# Patient Record
Sex: Female | Born: 1950
Health system: Southern US, Community
[De-identification: ages and names within clinical notes are randomized; demographics above are authoritative.]

## PROBLEM LIST (undated history)

## (undated) DIAGNOSIS — E119 Type 2 diabetes mellitus without complications: Secondary | ICD-10-CM

## (undated) DIAGNOSIS — M810 Age-related osteoporosis without current pathological fracture: Secondary | ICD-10-CM

## (undated) DIAGNOSIS — I10 Essential (primary) hypertension: Secondary | ICD-10-CM

## (undated) DIAGNOSIS — E785 Hyperlipidemia, unspecified: Secondary | ICD-10-CM

## (undated) DIAGNOSIS — C801 Malignant (primary) neoplasm, unspecified: Secondary | ICD-10-CM

## (undated) DIAGNOSIS — G629 Polyneuropathy, unspecified: Secondary | ICD-10-CM

## (undated) DIAGNOSIS — M1990 Unspecified osteoarthritis, unspecified site: Secondary | ICD-10-CM

## (undated) DIAGNOSIS — Z9109 Other allergy status, other than to drugs and biological substances: Secondary | ICD-10-CM

## (undated) DIAGNOSIS — K219 Gastro-esophageal reflux disease without esophagitis: Secondary | ICD-10-CM

## (undated) DIAGNOSIS — C50919 Malignant neoplasm of unspecified site of unspecified female breast: Secondary | ICD-10-CM

## (undated) HISTORY — DX: Type 2 diabetes mellitus without complications: E11.9

## (undated) HISTORY — DX: Age-related osteoporosis without current pathological fracture: M81.0

## (undated) HISTORY — DX: Polyneuropathy, unspecified: G62.9

## (undated) HISTORY — DX: Essential (primary) hypertension: I10

## (undated) HISTORY — DX: Other allergy status, other than to drugs and biological substances: Z91.09

## (undated) HISTORY — DX: Malignant (primary) neoplasm, unspecified: C80.1

## (undated) HISTORY — DX: Hyperlipidemia, unspecified: E78.5

## (undated) HISTORY — DX: Unspecified osteoarthritis, unspecified site: M19.90

## (undated) HISTORY — DX: Gastro-esophageal reflux disease without esophagitis: K21.9

---
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
## 1980-08-26 HISTORY — PX: APPENDECTOMY: SHX54

## 1982-08-26 HISTORY — PX: LAPAROSCOPIC SALPINGO OOPHERECTOMY: SHX5927

## 2000-11-18 ENCOUNTER — Ambulatory Visit (HOSPITAL_COMMUNITY): Admission: RE | Admit: 2000-11-18 | Discharge: 2000-11-18 | Payer: Self-pay | Admitting: Family Medicine

## 2000-11-18 ENCOUNTER — Encounter: Payer: Self-pay | Admitting: Family Medicine

## 2000-11-19 ENCOUNTER — Other Ambulatory Visit: Admission: RE | Admit: 2000-11-19 | Discharge: 2000-11-19 | Payer: Self-pay | Admitting: *Deleted

## 2000-11-24 ENCOUNTER — Encounter: Admission: RE | Admit: 2000-11-24 | Discharge: 2000-11-24 | Payer: Self-pay | Admitting: Family Medicine

## 2000-11-24 ENCOUNTER — Other Ambulatory Visit: Admission: RE | Admit: 2000-11-24 | Discharge: 2000-11-24 | Payer: Self-pay | Admitting: Family Medicine

## 2000-11-24 ENCOUNTER — Encounter: Payer: Self-pay | Admitting: Family Medicine

## 2000-11-25 ENCOUNTER — Encounter: Admission: RE | Admit: 2000-11-25 | Discharge: 2000-11-25 | Payer: Self-pay | Admitting: Family Medicine

## 2000-11-25 ENCOUNTER — Encounter: Payer: Self-pay | Admitting: Family Medicine

## 2000-12-02 ENCOUNTER — Encounter: Payer: Self-pay | Admitting: Surgery

## 2000-12-02 ENCOUNTER — Ambulatory Visit (HOSPITAL_COMMUNITY): Admission: RE | Admit: 2000-12-02 | Discharge: 2000-12-02 | Payer: Self-pay | Admitting: Surgery

## 2000-12-04 ENCOUNTER — Encounter: Admission: RE | Admit: 2000-12-04 | Discharge: 2001-03-04 | Payer: Self-pay | Admitting: Radiation Oncology

## 2000-12-12 ENCOUNTER — Ambulatory Visit (HOSPITAL_BASED_OUTPATIENT_CLINIC_OR_DEPARTMENT_OTHER): Admission: RE | Admit: 2000-12-12 | Discharge: 2000-12-13 | Payer: Self-pay | Admitting: Surgery

## 2000-12-12 ENCOUNTER — Encounter (INDEPENDENT_AMBULATORY_CARE_PROVIDER_SITE_OTHER): Payer: Self-pay | Admitting: *Deleted

## 2000-12-12 ENCOUNTER — Encounter: Payer: Self-pay | Admitting: Surgery

## 2000-12-12 DIAGNOSIS — C801 Malignant (primary) neoplasm, unspecified: Secondary | ICD-10-CM

## 2000-12-12 HISTORY — DX: Malignant (primary) neoplasm, unspecified: C80.1

## 2000-12-12 HISTORY — PX: BREAST LUMPECTOMY: SHX2

## 2000-12-29 ENCOUNTER — Ambulatory Visit (HOSPITAL_COMMUNITY): Admission: RE | Admit: 2000-12-29 | Discharge: 2000-12-29 | Payer: Self-pay | Admitting: Oncology

## 2000-12-29 ENCOUNTER — Encounter: Payer: Self-pay | Admitting: Oncology

## 2001-01-08 ENCOUNTER — Ambulatory Visit (HOSPITAL_COMMUNITY): Admission: RE | Admit: 2001-01-08 | Discharge: 2001-01-08 | Payer: Self-pay | Admitting: Oncology

## 2001-01-08 ENCOUNTER — Encounter: Payer: Self-pay | Admitting: Oncology

## 2001-01-12 ENCOUNTER — Encounter: Payer: Self-pay | Admitting: Surgery

## 2001-01-12 ENCOUNTER — Ambulatory Visit (HOSPITAL_COMMUNITY): Admission: RE | Admit: 2001-01-12 | Discharge: 2001-01-12 | Payer: Self-pay | Admitting: Surgery

## 2001-05-11 ENCOUNTER — Ambulatory Visit: Admission: RE | Admit: 2001-05-11 | Discharge: 2001-08-09 | Payer: Self-pay | Admitting: Radiation Oncology

## 2001-06-03 ENCOUNTER — Ambulatory Visit (HOSPITAL_BASED_OUTPATIENT_CLINIC_OR_DEPARTMENT_OTHER): Admission: RE | Admit: 2001-06-03 | Discharge: 2001-06-03 | Payer: Self-pay | Admitting: Surgery

## 2001-11-23 ENCOUNTER — Encounter: Payer: Self-pay | Admitting: Oncology

## 2001-11-23 ENCOUNTER — Encounter: Admission: RE | Admit: 2001-11-23 | Discharge: 2001-11-23 | Payer: Self-pay | Admitting: Oncology

## 2002-04-13 ENCOUNTER — Other Ambulatory Visit: Admission: RE | Admit: 2002-04-13 | Discharge: 2002-04-13 | Payer: Self-pay | Admitting: *Deleted

## 2002-07-21 ENCOUNTER — Ambulatory Visit (HOSPITAL_COMMUNITY): Admission: RE | Admit: 2002-07-21 | Discharge: 2002-07-21 | Payer: Self-pay | Admitting: Oncology

## 2002-07-21 ENCOUNTER — Encounter: Payer: Self-pay | Admitting: Oncology

## 2002-12-23 ENCOUNTER — Encounter: Payer: Self-pay | Admitting: Oncology

## 2002-12-23 ENCOUNTER — Encounter: Admission: RE | Admit: 2002-12-23 | Discharge: 2002-12-23 | Payer: Self-pay | Admitting: Oncology

## 2003-01-12 ENCOUNTER — Ambulatory Visit (HOSPITAL_COMMUNITY): Admission: RE | Admit: 2003-01-12 | Discharge: 2003-01-12 | Payer: Self-pay | Admitting: Oncology

## 2003-01-12 ENCOUNTER — Encounter: Payer: Self-pay | Admitting: Oncology

## 2003-04-13 ENCOUNTER — Ambulatory Visit (HOSPITAL_COMMUNITY): Admission: RE | Admit: 2003-04-13 | Discharge: 2003-04-13 | Payer: Self-pay | Admitting: Gastroenterology

## 2003-07-07 ENCOUNTER — Other Ambulatory Visit: Admission: RE | Admit: 2003-07-07 | Discharge: 2003-07-07 | Payer: Self-pay | Admitting: *Deleted

## 2003-11-22 ENCOUNTER — Encounter: Admission: RE | Admit: 2003-11-22 | Discharge: 2003-11-22 | Payer: Self-pay | Admitting: Oncology

## 2003-11-28 ENCOUNTER — Ambulatory Visit (HOSPITAL_COMMUNITY): Admission: RE | Admit: 2003-11-28 | Discharge: 2003-11-28 | Payer: Self-pay | Admitting: Oncology

## 2004-09-07 ENCOUNTER — Ambulatory Visit: Payer: Self-pay | Admitting: Oncology

## 2004-11-27 ENCOUNTER — Encounter: Admission: RE | Admit: 2004-11-27 | Discharge: 2004-11-27 | Payer: Self-pay | Admitting: Oncology

## 2005-03-04 ENCOUNTER — Ambulatory Visit: Payer: Self-pay | Admitting: Oncology

## 2005-09-02 ENCOUNTER — Ambulatory Visit: Payer: Self-pay | Admitting: Oncology

## 2005-10-25 ENCOUNTER — Ambulatory Visit: Payer: Self-pay | Admitting: Oncology

## 2005-12-17 ENCOUNTER — Ambulatory Visit (HOSPITAL_COMMUNITY): Admission: RE | Admit: 2005-12-17 | Discharge: 2005-12-17 | Payer: Self-pay | Admitting: Oncology

## 2005-12-17 ENCOUNTER — Encounter: Admission: RE | Admit: 2005-12-17 | Discharge: 2005-12-17 | Payer: Self-pay | Admitting: Oncology

## 2006-04-23 ENCOUNTER — Ambulatory Visit: Payer: Self-pay | Admitting: Oncology

## 2006-04-25 LAB — CANCER ANTIGEN 27.29: CA 27.29: 13 U/mL (ref 0–39)

## 2006-04-25 LAB — CBC WITH DIFFERENTIAL/PLATELET
BASO%: 0.4 % (ref 0.0–2.0)
HCT: 37.5 % (ref 34.8–46.6)
HGB: 12.1 g/dL (ref 11.6–15.9)
MCHC: 32.2 g/dL (ref 32.0–36.0)
MONO#: 0.8 10*3/uL (ref 0.1–0.9)
NEUT%: 51.8 % (ref 39.6–76.8)
RDW: 15.1 % — ABNORMAL HIGH (ref 11.3–14.5)
WBC: 7.9 10*3/uL (ref 3.9–10.0)
lymph#: 2.6 10*3/uL (ref 0.9–3.3)

## 2006-04-25 LAB — COMPREHENSIVE METABOLIC PANEL
ALT: 31 U/L (ref 0–40)
Albumin: 4.1 g/dL (ref 3.5–5.2)
CO2: 26 mEq/L (ref 19–32)
Calcium: 9.3 mg/dL (ref 8.4–10.5)
Chloride: 105 mEq/L (ref 96–112)
Creatinine, Ser: 0.63 mg/dL (ref 0.40–1.20)
Potassium: 4.3 mEq/L (ref 3.5–5.3)
Sodium: 141 mEq/L (ref 135–145)
Total Protein: 7.5 g/dL (ref 6.0–8.3)

## 2006-06-06 ENCOUNTER — Ambulatory Visit: Payer: Self-pay | Admitting: Oncology

## 2006-08-11 ENCOUNTER — Ambulatory Visit (HOSPITAL_COMMUNITY): Admission: RE | Admit: 2006-08-11 | Discharge: 2006-08-11 | Payer: Self-pay | Admitting: Oncology

## 2006-12-09 ENCOUNTER — Ambulatory Visit: Payer: Self-pay | Admitting: Oncology

## 2006-12-09 LAB — CBC WITH DIFFERENTIAL/PLATELET
BASO%: 0.4 % (ref 0.0–2.0)
EOS%: 3.7 % (ref 0.0–7.0)
MCH: 24.7 pg — ABNORMAL LOW (ref 26.0–34.0)
MCHC: 32.9 g/dL (ref 32.0–36.0)
MCV: 75.3 fL — ABNORMAL LOW (ref 81.0–101.0)
MONO%: 8.2 % (ref 0.0–13.0)
RBC: 4.92 10*6/uL (ref 3.70–5.32)
RDW: 14.9 % — ABNORMAL HIGH (ref 11.3–14.5)
lymph#: 3 10*3/uL (ref 0.9–3.3)

## 2006-12-09 LAB — COMPREHENSIVE METABOLIC PANEL
AST: 26 U/L (ref 0–37)
Alkaline Phosphatase: 91 U/L (ref 39–117)
BUN: 8 mg/dL (ref 6–23)
Calcium: 9 mg/dL (ref 8.4–10.5)
Chloride: 106 mEq/L (ref 96–112)
Creatinine, Ser: 0.67 mg/dL (ref 0.40–1.20)

## 2006-12-23 ENCOUNTER — Encounter: Admission: RE | Admit: 2006-12-23 | Discharge: 2006-12-23 | Payer: Self-pay | Admitting: Oncology

## 2007-01-12 ENCOUNTER — Encounter: Admission: RE | Admit: 2007-01-12 | Discharge: 2007-01-12 | Payer: Self-pay | Admitting: Oncology

## 2007-02-09 ENCOUNTER — Ambulatory Visit: Payer: Self-pay | Admitting: Oncology

## 2007-04-12 ENCOUNTER — Ambulatory Visit: Payer: Self-pay | Admitting: Oncology

## 2007-04-16 LAB — FOLLICLE STIMULATING HORMONE: FSH: 12.4 m[IU]/mL

## 2007-04-24 LAB — ESTRADIOL, ULTRA SENS: Estradiol, Ultra Sensitive: 15 pg/mL

## 2007-07-10 ENCOUNTER — Ambulatory Visit: Payer: Self-pay | Admitting: Oncology

## 2007-07-16 LAB — COMPREHENSIVE METABOLIC PANEL
Albumin: 4.2 g/dL (ref 3.5–5.2)
BUN: 11 mg/dL (ref 6–23)
CO2: 24 mEq/L (ref 19–32)
Glucose, Bld: 84 mg/dL (ref 70–99)
Potassium: 4.3 mEq/L (ref 3.5–5.3)
Sodium: 140 mEq/L (ref 135–145)
Total Protein: 7.5 g/dL (ref 6.0–8.3)

## 2007-07-16 LAB — CBC WITH DIFFERENTIAL/PLATELET
BASO%: 0.1 % (ref 0.0–2.0)
HCT: 37.7 % (ref 34.8–46.6)
MCHC: 32.9 g/dL (ref 32.0–36.0)
MONO#: 0.6 10*3/uL (ref 0.1–0.9)
NEUT%: 46.7 % (ref 39.6–76.8)
RDW: 14.1 % (ref 11.3–14.5)
WBC: 6.9 10*3/uL (ref 3.9–10.0)
lymph#: 2.9 10*3/uL (ref 0.9–3.3)

## 2007-07-16 LAB — CANCER ANTIGEN 27.29: CA 27.29: 15 U/mL (ref 0–39)

## 2007-07-16 LAB — FOLLICLE STIMULATING HORMONE: FSH: 13 m[IU]/mL

## 2007-07-27 LAB — ESTRADIOL, ULTRA SENS: Estradiol, Ultra Sensitive: 16 pg/mL

## 2007-07-28 LAB — BASIC METABOLIC PANEL
BUN: 9 mg/dL (ref 6–23)
Calcium: 9.7 mg/dL (ref 8.4–10.5)
Creatinine, Ser: 0.64 mg/dL (ref 0.40–1.20)

## 2007-12-08 ENCOUNTER — Ambulatory Visit: Payer: Self-pay | Admitting: Oncology

## 2007-12-10 LAB — CBC WITH DIFFERENTIAL/PLATELET
BASO%: 0.4 % (ref 0.0–2.0)
Basophils Absolute: 0 10*3/uL (ref 0.0–0.1)
EOS%: 3.1 % (ref 0.0–7.0)
MCH: 24.4 pg — ABNORMAL LOW (ref 26.0–34.0)
MCHC: 32.8 g/dL (ref 32.0–36.0)
MCV: 74.6 fL — ABNORMAL LOW (ref 81.0–101.0)
MONO%: 6.1 % (ref 0.0–13.0)
RBC: 5.45 10*6/uL — ABNORMAL HIGH (ref 3.70–5.32)
RDW: 15.2 % — ABNORMAL HIGH (ref 11.3–14.5)
lymph#: 3.3 10*3/uL (ref 0.9–3.3)

## 2007-12-11 LAB — COMPREHENSIVE METABOLIC PANEL
ALT: 22 U/L (ref 0–35)
AST: 21 U/L (ref 0–37)
Albumin: 4.4 g/dL (ref 3.5–5.2)
Alkaline Phosphatase: 74 U/L (ref 39–117)
BUN: 14 mg/dL (ref 6–23)
Potassium: 4.2 mEq/L (ref 3.5–5.3)
Sodium: 141 mEq/L (ref 135–145)

## 2007-12-11 LAB — VITAMIN D 25 HYDROXY (VIT D DEFICIENCY, FRACTURES): Vit D, 25-Hydroxy: 63 ng/mL (ref 30–89)

## 2007-12-17 LAB — VITAMIN D 1,25 DIHYDROXY: Vit D, 1,25-Dihydroxy: 37 pg/mL (ref 15–75)

## 2007-12-31 ENCOUNTER — Encounter: Admission: RE | Admit: 2007-12-31 | Discharge: 2007-12-31 | Payer: Self-pay | Admitting: Oncology

## 2008-02-17 ENCOUNTER — Ambulatory Visit: Payer: Self-pay | Admitting: Oncology

## 2008-02-19 LAB — BASIC METABOLIC PANEL
BUN: 13 mg/dL (ref 6–23)
CO2: 23 mEq/L (ref 19–32)
Calcium: 8.6 mg/dL (ref 8.4–10.5)
Chloride: 105 mEq/L (ref 96–112)
Creatinine, Ser: 0.65 mg/dL (ref 0.40–1.20)
Glucose, Bld: 95 mg/dL (ref 70–99)

## 2008-06-28 ENCOUNTER — Ambulatory Visit: Payer: Self-pay | Admitting: Oncology

## 2008-06-30 LAB — CBC WITH DIFFERENTIAL/PLATELET
BASO%: 0.5 % (ref 0.0–2.0)
Basophils Absolute: 0 10*3/uL (ref 0.0–0.1)
EOS%: 5.2 % (ref 0.0–7.0)
LYMPH%: 28.5 % (ref 14.0–48.0)
MCH: 24.4 pg — ABNORMAL LOW (ref 26.0–34.0)
MCHC: 32.2 g/dL (ref 32.0–36.0)
MONO#: 0.5 10*3/uL (ref 0.1–0.9)
MONO%: 6.5 % (ref 0.0–13.0)
NEUT#: 4.3 10*3/uL (ref 1.5–6.5)
Platelets: 249 10*3/uL (ref 145–400)
WBC: 7.3 10*3/uL (ref 3.9–10.0)
lymph#: 2.1 10*3/uL (ref 0.9–3.3)

## 2008-07-01 LAB — COMPREHENSIVE METABOLIC PANEL
ALT: 15 U/L (ref 0–35)
AST: 19 U/L (ref 0–37)
Albumin: 4.3 g/dL (ref 3.5–5.2)
BUN: 11 mg/dL (ref 6–23)
CO2: 27 mEq/L (ref 19–32)
Sodium: 142 mEq/L (ref 135–145)

## 2008-07-01 LAB — CANCER ANTIGEN 27.29: CA 27.29: 21 U/mL (ref 0–39)

## 2009-01-09 ENCOUNTER — Encounter: Admission: RE | Admit: 2009-01-09 | Discharge: 2009-01-09 | Payer: Self-pay | Admitting: Surgery

## 2009-01-25 ENCOUNTER — Ambulatory Visit: Payer: Self-pay | Admitting: Oncology

## 2009-05-02 ENCOUNTER — Encounter: Payer: Self-pay | Admitting: Family Medicine

## 2009-05-02 ENCOUNTER — Ambulatory Visit: Payer: Self-pay | Admitting: Family Medicine

## 2009-05-02 ENCOUNTER — Other Ambulatory Visit: Admission: RE | Admit: 2009-05-02 | Discharge: 2009-05-02 | Payer: Self-pay | Admitting: Family Medicine

## 2009-05-02 DIAGNOSIS — E785 Hyperlipidemia, unspecified: Secondary | ICD-10-CM | POA: Insufficient documentation

## 2009-05-03 DIAGNOSIS — E1165 Type 2 diabetes mellitus with hyperglycemia: Secondary | ICD-10-CM

## 2009-05-03 DIAGNOSIS — E1140 Type 2 diabetes mellitus with diabetic neuropathy, unspecified: Secondary | ICD-10-CM

## 2009-05-03 DIAGNOSIS — E118 Type 2 diabetes mellitus with unspecified complications: Secondary | ICD-10-CM | POA: Insufficient documentation

## 2009-05-03 DIAGNOSIS — E119 Type 2 diabetes mellitus without complications: Secondary | ICD-10-CM | POA: Insufficient documentation

## 2009-05-04 LAB — CONVERTED CEMR LAB
Alkaline Phosphatase: 55 units/L (ref 39–117)
BUN: 10 mg/dL (ref 6–23)
Calcium: 9.6 mg/dL (ref 8.4–10.5)
Cholesterol: 253 mg/dL — ABNORMAL HIGH (ref 0–200)
Creatinine, Ser: 0.68 mg/dL (ref 0.40–1.20)
Glucose, Bld: 112 mg/dL — ABNORMAL HIGH (ref 70–99)
Potassium: 4.5 meq/L (ref 3.5–5.3)
Total Bilirubin: 0.4 mg/dL (ref 0.3–1.2)
Total Protein: 8 g/dL (ref 6.0–8.3)

## 2009-06-27 ENCOUNTER — Ambulatory Visit: Payer: Self-pay | Admitting: Oncology

## 2009-06-29 LAB — CBC WITH DIFFERENTIAL/PLATELET
Basophils Absolute: 0.1 10*3/uL (ref 0.0–0.1)
Eosinophils Absolute: 0.4 10*3/uL (ref 0.0–0.5)
HGB: 12.7 g/dL (ref 11.6–15.9)
MCHC: 31.7 g/dL (ref 31.5–36.0)
MCV: 76.7 fL — ABNORMAL LOW (ref 79.5–101.0)
MONO#: 0.6 10*3/uL (ref 0.1–0.9)
Platelets: 218 10*3/uL (ref 145–400)
nRBC: 0 % (ref 0–0)

## 2009-06-30 LAB — VITAMIN D 25 HYDROXY (VIT D DEFICIENCY, FRACTURES): Vit D, 25-Hydroxy: 88 ng/mL (ref 30–89)

## 2009-06-30 LAB — COMPREHENSIVE METABOLIC PANEL
BUN: 11 mg/dL (ref 6–23)
Calcium: 9.8 mg/dL (ref 8.4–10.5)
Glucose, Bld: 86 mg/dL (ref 70–99)
Potassium: 4.4 mEq/L (ref 3.5–5.3)
Sodium: 141 mEq/L (ref 135–145)

## 2009-06-30 LAB — CANCER ANTIGEN 27.29: CA 27.29: 21 U/mL (ref 0–39)

## 2009-07-27 ENCOUNTER — Ambulatory Visit: Payer: Self-pay | Admitting: Oncology

## 2010-01-15 ENCOUNTER — Encounter: Admission: RE | Admit: 2010-01-15 | Discharge: 2010-01-15 | Payer: Self-pay | Admitting: Surgery

## 2010-02-01 ENCOUNTER — Ambulatory Visit: Payer: Self-pay | Admitting: Oncology

## 2010-06-26 ENCOUNTER — Ambulatory Visit: Payer: Self-pay | Admitting: Oncology

## 2010-06-28 LAB — CBC WITH DIFFERENTIAL/PLATELET
EOS%: 4.1 % (ref 0.0–7.0)
Eosinophils Absolute: 0.3 10*3/uL (ref 0.0–0.5)
LYMPH%: 38 % (ref 14.0–49.7)
MCH: 24.4 pg — ABNORMAL LOW (ref 25.1–34.0)
MCHC: 32 g/dL (ref 31.5–36.0)
MONO#: 0.5 10*3/uL (ref 0.1–0.9)
NEUT#: 3.4 10*3/uL (ref 1.5–6.5)
NEUT%: 50.3 % (ref 38.4–76.8)
RDW: 14.6 % — ABNORMAL HIGH (ref 11.2–14.5)
lymph#: 2.6 10*3/uL (ref 0.9–3.3)

## 2010-06-29 LAB — COMPREHENSIVE METABOLIC PANEL
ALT: 21 U/L (ref 0–35)
AST: 22 U/L (ref 0–37)
Albumin: 4.6 g/dL (ref 3.5–5.2)
Alkaline Phosphatase: 67 U/L (ref 39–117)
Creatinine, Ser: 0.79 mg/dL (ref 0.40–1.20)
Sodium: 140 mEq/L (ref 135–145)

## 2010-06-29 LAB — VITAMIN D 25 HYDROXY (VIT D DEFICIENCY, FRACTURES): Vit D, 25-Hydroxy: 38 ng/mL (ref 30–89)

## 2010-06-29 LAB — CANCER ANTIGEN 27.29: CA 27.29: 23 U/mL (ref 0–39)

## 2010-08-02 ENCOUNTER — Ambulatory Visit: Payer: Self-pay | Admitting: Oncology

## 2010-08-02 DIAGNOSIS — J301 Allergic rhinitis due to pollen: Secondary | ICD-10-CM

## 2010-08-06 LAB — BASIC METABOLIC PANEL
CO2: 24 mEq/L (ref 19–32)
Creatinine, Ser: 0.73 mg/dL (ref 0.40–1.20)
Glucose, Bld: 135 mg/dL — ABNORMAL HIGH (ref 70–99)
Potassium: 3.9 mEq/L (ref 3.5–5.3)

## 2010-09-15 ENCOUNTER — Other Ambulatory Visit: Payer: Self-pay | Admitting: Oncology

## 2010-09-15 DIAGNOSIS — Z853 Personal history of malignant neoplasm of breast: Secondary | ICD-10-CM

## 2010-09-15 DIAGNOSIS — Z0000 Encounter for general adult medical examination without abnormal findings: Secondary | ICD-10-CM

## 2010-09-15 DIAGNOSIS — Z780 Asymptomatic menopausal state: Secondary | ICD-10-CM

## 2011-01-11 NOTE — Op Note (Signed)
[place]. [facility]  Patient:    Elizabeth Vang, Elizabeth Vang                 MRN: [id number] Proc. Date: 12/12/00 Adm. Date:  [id number] Disc. Date: [id number] Attending:  Andre Lefort CC:         Chales Salmon. Abigail Miyamoto, M.D.  Daryl Eastern, M.D.   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  POSTOPERATIVE DIAGNOSIS:  Carcinoma of the right breast 3 oclock position with two masses per mammogram/ ultrasound and positive sentinel lymph node biopsy.  OPERATION:  Needle localization x 2 with a segmental mastectomy of the right breast, excising the breast tissue around the 3 oclock position, injection of isosulfan blue, sentinel lymph node biopsy, right axillary node dissection.  SURGEON:  Sandria Bales. Ezzard Standing, M.D.  ANESTHESIA:  General LMA.  ESTIMATED BLOOD LOSS: 100 cc.  DRAIN LEFT IN:  10 mm Blake.  COMPLICATIONS:  None.  INDICATIONS:  Elizabeth Vang is a 60 year old, just turned 60 year old black female who has a biopsy-proven carcinoma in the medial aspect of her right breast.  Actually by mammogram, there is a suggestion of two masses in this area.  I spoke with Dr. Cain Saupe about trying to bracket these two masses with needle localization a the time of excision.  So we planned lumpectomy (segmental mastectomy) and sentinel lymph node biopsy and also discussed the possibility of open axillary node dissection.  DESCRIPTION OF PROCEDURE:  The patient was placed in a supine position, given a general anesthesia using LMA.  Her right breast was prepped with Betadine solution and sterilely draped.  Her nipple had already been injected with the radioactive colloid.  I then injected isosulfan blue, two 0.5 cc injections around her nipple, 1 cc at the site of her tumor.  I then started with the axillary dissection.  I identified a hot  area immediately posterior to the pectoralis muscle anterior in the axilla and took my dissection down to finding actually an abnormal-looking node which was probably 2.5 cm in size.  This node was highly suspicious for malignancy.  I sent it off, and ______ called me and said it did have cancer cells in it.  While this was going on, I did a wide excision of her right breast around the 3 oclock position.  I used both wires and excised a length of skin about 10 cm long and about 3 cm wide.  I tried to get well outside of any palpable area of mass.  I carried this excision all the way down to the chest wall and then marked the tissue medial towards the sternum with a long suture cephalad with a shorter suture and sent it to specimen mammography to confirm I got all the suspicious area out.  I then sent it permanently to pathology.  I then turned my attention back to the axilla where I extended her right axillary incision, took my dissection up to the axillary vein which was identified, swept all the axillary contents inferiorly, took two tributaries off the axillary vein using the clip applier.  I identified the long thoracic nerve and dorsal nerves posteriorly and took the axillary contents as a block specimen.  I irrigated the axilla.  Hemostasis was controlled with Bovie electrocautery, clips, and 3-0 Vicryl popoff.  I then placed a 10 mm Blake drain through a stab  wound inferior to the axillary incision, closed the skin with 3-0 Vicryl sutures and a skin gun for the axilla, sewed in the drain with 2-0 nylon suture, and then I turned my attention to the breast biopsy site.  I irrigated this out, controlled the bleeding with Bovie electrocautery and 3-0 Vicryl sutures.  I closed the subcutaneous tissue with 3-0 Vicryl sutures, closed the skin with a 5-0 Monocryl suture, painted the wound with tincture of Benzoin and Steri-Stripped the wound.  I then sterilely dressed both wounds. The  patient will be kept overnight for observation and instruction on how to change the drain and will plan to be discharged home tomorrow.  She will see me back in four to five days for wound check. DD:  12/12/00 TD:  12/15/00 Job: [id number]

## 2011-01-11 NOTE — Op Note (Signed)
   NAME:  Elizabeth Vang, Elizabeth Vang                    ACCOUNT NO.:  [id number]   MEDICAL RECORD NO.:  [id number]                   PATIENT TYPE:  AMB   LOCATION:  ENDO                                 FACILITY:  [facility]   PHYSICIAN:  Bernette Redbird, M.D.                DATE OF BIRTH:  05-Apr-1951   DATE OF PROCEDURE:  04/13/2003  DATE OF DISCHARGE:                                 OPERATIVE REPORT   PROCEDURE:  Colonoscopy.   INDICATION:  Screening for colon cancer in a 60 year old female.   FINDINGS:  Normal exam to the cecum.   DESCRIPTION OF PROCEDURE:  The nature, purpose, and risks of the procedure  had been discussed with the patient who provided written consent.  Sedation  was fentanyl 75 mcg and Versed 6 mg IV without arrhythmias or desaturation.   The Olympus adult video colonoscope was advanced without significant  difficulty around the colon, turning the patient into the supine position to  get the tip of the scope to enter the base of the cecum which was identified  by visualization of the appendiceal orifice.  Pullback was then performed.  The quality of the prep was excellent, and it is felt that all areas were  well-seen.   This was a normal examination.  No polyps, cancer, colitis, vascular  malformations, or diverticulosis were noted.  Retroflexion could not be  accomplished in the rectum, but re-inspection and repeat re-inspection of  the rectosigmoid was unremarkable.  No biopsy were obtained.  The patient  tolerated the procedure well, and there were no apparent complications.    IMPRESSION:  Normal screening colonoscopy.   PLAN:  In view of the patient's history of breast cancer, I might consider a  repeat colonoscopy in five years.                                               Bernette Redbird, M.D.    RB/MEDQ  D:  04/13/2003  T:  04/13/2003  Job:  [id number]   cc:   Valentino Hue. Magrinat, M.D.  [address] [facility]  [place]  [place] [address]  Fax: [phone]   Chales Salmon. Abigail Miyamoto, M.D.  [address]  [place]  [place] [address]  Fax: [phone]

## 2011-01-11 NOTE — Op Note (Signed)
[facility]  Patient:    Elizabeth Vang, Elizabeth Vang                 MRN: [id number] Proc. Date: 01/12/01 Adm. Date:  [id number] Attending:  Andre Lefort CC:         Chales Salmon. Abigail Miyamoto, M.D.  Valentino Hue. Magrinat, M.D.   Operative Report  DATE OF BIRTH:  1951-03-09  PREOPERATIVE DIAGNOSIS:  Right breast cancer, need of body access.  POSTOPERATIVE DIAGNOSIS:  Right breast cancer, need of body access.  PROCEDURE:  Left subclavian Port-A-Cath placement.  SURGEON:  Sandria Bales. Ezzard Standing, M.D.  FIRST ASSISTANT:  None.  ANESTHESIA:  MAC with approximately 30 cc of 1% Xylocaine.  DESCRIPTION OF PROCEDURE:  Ms. Oatley is a 60 year old black female, who has stage 2 carcinoma of the right breast anticipating chemotherapy by Dr. Jeanette Caprice, comes for placement of a left subclavian Port-A-Cath.  DESCRIPTION OF PROCEDURE:  The patient placed in a Trendelenburg position with a roll under her back, given 1 gram of Ancef at the initiation of the procedure.  Her upper shoulders were prepped with Betadine solution and sterilely draped.  Ms. Kucinski is fully big-boned, and I had to stick her about 4-5 times to get under her clavicle into the left subclavian vein.  I was able to access the vein through the guidewire, and the guidewires position was checked with fluoroscopy showing position was in the superior vena cava.  I then developed a reservoir site pocket, sewed the reservoir in using 3-0 Vicryl sutures, passed the Silastic tubing from the reservoir site to the subclavian suture site and threaded it through an 8.9 [place] introducer.  It was fairly tight, so I then placed a 10 [place] Cook introducer.  This dilated up the area where the tubing went in easily.  I was then able to easily inject and aspirate saline.  I positioned the tip of the catheter using fluoroscopy in the superior vena cava at the atrial orifice.  I then attached the Silastic tubing  to the reservoir using a bayonet device, flushed the entire unit with heparinized saline and then checked the position of both the reservoir and the tubing, all without a kink.  It was seen to be going fairly well.  I then sewed the port reservoir site with 3-0 Vicryl sutures, the skin with a 5-0 subcuticular Vicryl suture, painted with Benzoin, Steri-Strips, and sterile dressed the wound.  I then placed a right angle Huber needle in, used concentrated heparin 100 units per cc as a flush in preparation for her chemotherapy tomorrow.  She will shower in two days, will be placed on Coumadin and see me back in one week for follow-up.  Chest x-ray is pending at the time of dictation. DD:  01/12/01 TD:  01/12/01 Job: [id number]

## 2011-01-14 ENCOUNTER — Inpatient Hospital Stay: Admission: RE | Admit: 2011-01-14 | Payer: Self-pay | Source: Ambulatory Visit

## 2011-01-16 ENCOUNTER — Ambulatory Visit
Admission: RE | Admit: 2011-01-16 | Discharge: 2011-01-16 | Disposition: A | Discharge status: Home / Self Care | Payer: Self-pay | Source: Ambulatory Visit | Attending: Oncology | Admitting: Oncology

## 2011-01-16 DIAGNOSIS — Z0000 Encounter for general adult medical examination without abnormal findings: Secondary | ICD-10-CM

## 2011-01-16 DIAGNOSIS — Z853 Personal history of malignant neoplasm of breast: Secondary | ICD-10-CM

## 2011-01-16 DIAGNOSIS — Z780 Asymptomatic menopausal state: Secondary | ICD-10-CM

## 2011-05-14 ENCOUNTER — Other Ambulatory Visit: Payer: Self-pay | Admitting: Oncology

## 2011-05-14 ENCOUNTER — Encounter (HOSPITAL_BASED_OUTPATIENT_CLINIC_OR_DEPARTMENT_OTHER): Payer: 59 | Admitting: Oncology

## 2011-05-14 DIAGNOSIS — C50319 Malignant neoplasm of lower-inner quadrant of unspecified female breast: Secondary | ICD-10-CM

## 2011-05-14 DIAGNOSIS — Z170 Estrogen receptor positive status [ER+]: Secondary | ICD-10-CM

## 2011-05-14 DIAGNOSIS — M899 Disorder of bone, unspecified: Secondary | ICD-10-CM

## 2011-05-14 DIAGNOSIS — M949 Disorder of cartilage, unspecified: Secondary | ICD-10-CM

## 2011-05-14 LAB — COMPREHENSIVE METABOLIC PANEL
Albumin: 4.2 g/dL (ref 3.5–5.2)
BUN: 10 mg/dL (ref 6–23)
Calcium: 9.3 mg/dL (ref 8.4–10.5)
Chloride: 104 mEq/L (ref 96–112)
Creatinine, Ser: 0.69 mg/dL (ref 0.50–1.10)
Glucose, Bld: 87 mg/dL (ref 70–99)
Potassium: 4.4 mEq/L (ref 3.5–5.3)

## 2011-05-14 LAB — CBC WITH DIFFERENTIAL/PLATELET
Eosinophils Absolute: 0.5 10*3/uL (ref 0.0–0.5)
HGB: 12.3 g/dL (ref 11.6–15.9)
MONO#: 0.8 10*3/uL (ref 0.1–0.9)
MONO%: 10.1 % (ref 0.0–14.0)
NEUT#: 3.6 10*3/uL (ref 1.5–6.5)
RBC: 5.06 10*6/uL (ref 3.70–5.45)
RDW: 14.9 % — ABNORMAL HIGH (ref 11.2–14.5)
WBC: 7.7 10*3/uL (ref 3.9–10.3)
lymph#: 2.7 10*3/uL (ref 0.9–3.3)

## 2011-05-14 LAB — CANCER ANTIGEN 27.29: CA 27.29: 21 U/mL (ref 0–39)

## 2011-05-14 LAB — VITAMIN D 25 HYDROXY (VIT D DEFICIENCY, FRACTURES): Vit D, 25-Hydroxy: 53 ng/mL (ref 30–89)

## 2011-05-21 ENCOUNTER — Encounter (HOSPITAL_BASED_OUTPATIENT_CLINIC_OR_DEPARTMENT_OTHER): Payer: 59 | Admitting: Oncology

## 2011-05-21 DIAGNOSIS — M810 Age-related osteoporosis without current pathological fracture: Secondary | ICD-10-CM

## 2011-05-21 DIAGNOSIS — C50919 Malignant neoplasm of unspecified site of unspecified female breast: Secondary | ICD-10-CM

## 2011-06-03 ENCOUNTER — Encounter (HOSPITAL_BASED_OUTPATIENT_CLINIC_OR_DEPARTMENT_OTHER): Payer: 59 | Admitting: Oncology

## 2011-06-03 ENCOUNTER — Other Ambulatory Visit: Payer: Self-pay | Admitting: Oncology

## 2011-06-03 DIAGNOSIS — M810 Age-related osteoporosis without current pathological fracture: Secondary | ICD-10-CM

## 2011-06-03 LAB — BASIC METABOLIC PANEL
BUN: 15 mg/dL (ref 6–23)
CO2: 22 mEq/L (ref 19–32)
Calcium: 9.6 mg/dL (ref 8.4–10.5)
Chloride: 103 mEq/L (ref 96–112)
Creatinine, Ser: 0.69 mg/dL (ref 0.50–1.10)
Glucose, Bld: 85 mg/dL (ref 70–99)

## 2011-12-30 ENCOUNTER — Other Ambulatory Visit: Payer: Self-pay | Admitting: Oncology

## 2011-12-30 DIAGNOSIS — Z1231 Encounter for screening mammogram for malignant neoplasm of breast: Secondary | ICD-10-CM

## 2012-01-22 ENCOUNTER — Telehealth: Payer: Self-pay

## 2012-01-22 ENCOUNTER — Telehealth: Payer: Self-pay | Admitting: *Deleted

## 2012-01-22 ENCOUNTER — Other Ambulatory Visit: Payer: Self-pay

## 2012-01-22 DIAGNOSIS — C50919 Malignant neoplasm of unspecified site of unspecified female breast: Secondary | ICD-10-CM

## 2012-01-22 MED ORDER — CEPHALEXIN 500 MG PO CAPS: 500.0000 mg | ORAL_CAPSULE | Freq: Two times a day (BID) | ORAL | Status: AC

## 2012-01-22 NOTE — Telephone Encounter (Signed)
Called pt to inform her per Dr. Darnelle Catalan, ok to call in antibiotic (keflex 500 mg po bid x 5days), and pt may come by office if she feels like this should be examined.  Pt states this is "the exact same thing as before," and she does not feel like she needs to come in to office.  Informed her to call office back if symptoms do not resolve.  Pt verbalizes understanding.

## 2012-01-22 NOTE — Telephone Encounter (Signed)
Received call from pt asking for an ATB & reports that she has cellulitis in her breast again & it is hot, inflamed, swollen, & painful.  She would like something called to [facility] Out pt. Pharm.  She can be reached at [phone].  Note to Dr. Darnelle Catalan.

## 2012-01-22 NOTE — Telephone Encounter (Signed)
Received message from pt stating that she believes she has another inflammation in her R breast where "her cancer was."  Pt states she believes this may be a cellulitis, and she has had antibiotics before for this, would like a prescription called in to [facility] outpt pharmacy.  She states her breast is hot, red, tight, and painful.  Return number is [phone].  Note to MD.

## 2012-01-28 ENCOUNTER — Ambulatory Visit
Admission: RE | Admit: 2012-01-28 | Discharge: 2012-01-28 | Disposition: A | Discharge status: Home / Self Care | Payer: 59 | Source: Ambulatory Visit | Attending: Oncology | Admitting: Oncology

## 2012-01-28 DIAGNOSIS — Z1231 Encounter for screening mammogram for malignant neoplasm of breast: Secondary | ICD-10-CM

## 2012-02-19 ENCOUNTER — Telehealth: Payer: Self-pay | Admitting: Oncology

## 2012-02-19 NOTE — Telephone Encounter (Signed)
lmonvm for pt re appts for 7/11 and 7/18. Schedule mailed.

## 2012-03-05 ENCOUNTER — Other Ambulatory Visit (HOSPITAL_BASED_OUTPATIENT_CLINIC_OR_DEPARTMENT_OTHER): Payer: 59 | Admitting: Lab

## 2012-03-05 ENCOUNTER — Other Ambulatory Visit: Payer: 59 | Admitting: Lab

## 2012-03-05 DIAGNOSIS — C50919 Malignant neoplasm of unspecified site of unspecified female breast: Secondary | ICD-10-CM

## 2012-03-05 LAB — CBC WITH DIFFERENTIAL/PLATELET
BASO%: 0.7 % (ref 0.0–2.0)
Basophils Absolute: 0.1 10*3/uL (ref 0.0–0.1)
EOS%: 4.7 % (ref 0.0–7.0)
HGB: 12.8 g/dL (ref 11.6–15.9)
MCH: 23.9 pg — ABNORMAL LOW (ref 25.1–34.0)
MCHC: 32.2 g/dL (ref 31.5–36.0)
MCV: 74.4 fL — ABNORMAL LOW (ref 79.5–101.0)
MONO%: 5.7 % (ref 0.0–14.0)
RBC: 5.35 10*6/uL (ref 3.70–5.45)
RDW: 14.6 % — ABNORMAL HIGH (ref 11.2–14.5)
lymph#: 3.2 10*3/uL (ref 0.9–3.3)

## 2012-03-06 LAB — COMPREHENSIVE METABOLIC PANEL
AST: 25 U/L (ref 0–37)
Alkaline Phosphatase: 78 U/L (ref 39–117)
BUN: 11 mg/dL (ref 6–23)
Glucose, Bld: 87 mg/dL (ref 70–99)
Potassium: 4.1 mEq/L (ref 3.5–5.3)
Sodium: 141 mEq/L (ref 135–145)
Total Bilirubin: 0.4 mg/dL (ref 0.3–1.2)
Total Protein: 7.6 g/dL (ref 6.0–8.3)

## 2012-03-12 ENCOUNTER — Ambulatory Visit: Payer: 59

## 2012-03-12 ENCOUNTER — Ambulatory Visit (HOSPITAL_BASED_OUTPATIENT_CLINIC_OR_DEPARTMENT_OTHER): Payer: 59 | Admitting: Oncology

## 2012-03-12 VITALS — BP 165/105 | HR 105 | Temp 98.5°F | Ht 66.0 in | Wt 231.6 lb

## 2012-03-12 DIAGNOSIS — C50919 Malignant neoplasm of unspecified site of unspecified female breast: Secondary | ICD-10-CM | POA: Insufficient documentation

## 2012-03-12 DIAGNOSIS — R1310 Dysphagia, unspecified: Secondary | ICD-10-CM

## 2012-03-12 DIAGNOSIS — N61 Mastitis without abscess: Secondary | ICD-10-CM

## 2012-03-12 DIAGNOSIS — C50319 Malignant neoplasm of lower-inner quadrant of unspecified female breast: Secondary | ICD-10-CM

## 2012-03-12 MED ORDER — METOCLOPRAMIDE HCL 10 MG PO TABS: 5.0000 mg | ORAL_TABLET | Freq: Three times a day (TID) | ORAL | Status: DC

## 2012-03-12 NOTE — Progress Notes (Signed)
ID: Elizabeth Vang   DOB: 09-08-1950  MR#: [id number]  CSN#:[id number]  INTERVAL HISTORY: Blayre returns for routine followup of her breast cancer. She continues to work at [employer], and tells me her family is doing "good". She is very active in her church, and is able to exercise at least twice a week.   REVIEW OF SYSTEMS:  she had another episode of cellulitis involving the right breast. This didn't clear with antibiotics, but she wonders what brings this on. She is also noticing some swallowing discomfort. She does not find that Nexium is helpful with this. The food this feels like he doesn't want to go down and occasionally she can't it "choked". She is having some issues in her ankles hips and knees and generally feels achy all over at least part of that time. This is not a new symptom and it is not more pronounced or persistent than before. A detailed review of systems was otherwise entirely negative.   HEALTH MAINTENANCE: History  Substance Use Topics  . Smoking status: Not on file  . Smokeless tobacco: Not on file  . Alcohol Use: Not on file    Allergies  Allergen Reactions  . Oxycodone-Acetaminophen     Current Outpatient Prescriptions  Medication Sig Dispense Refill  . anastrozole (ARIMIDEX) 1 MG tablet Take 1 mg by mouth daily.      Marland Kitchen esomeprazole (NEXIUM) 20 MG capsule Take 20 mg by mouth daily before breakfast.        OBJECTIVE: Middle-aged African American woman in no acute distress  Filed Vitals:   03/12/12 1508  BP: 165/105  Pulse: 105  Temp: 98.5 F (36.9 C)     Body mass index is 37.38 kg/(m^2).    ECOG FS: 1  Sclerae unicteric Oropharynx clear No cervical or supraclavicular adenopathy Lungs no rales or rhonchi Heart regular rate and rhythm Abd benign MSK no focal spinal tenderness, no peripheral edema Neuro: nonfocal Breasts:the right breast is status post lumpectomy. There is no evidence of local recurrence. The right axilla is clear.  The left breast is unremarkable.   LAB RESULTS: Lab Results  Component Value Date   WBC 7.7 03/05/2012   NEUTROABS 3.7 03/05/2012   HGB 12.8 03/05/2012   HCT 39.8 03/05/2012   MCV 74.4* 03/05/2012   PLT 230 03/05/2012      Chemistry      Component Value Date/Time   NA 141 03/05/2012 1212   K 4.1 03/05/2012 1212   CL 103 03/05/2012 1212   CO2 28 03/05/2012 1212   BUN 11 03/05/2012 1212   CREATININE 0.66 03/05/2012 1212      Component Value Date/Time   CALCIUM 9.6 03/05/2012 1212   ALKPHOS 78 03/05/2012 1212   AST 25 03/05/2012 1212   ALT 20 03/05/2012 1212   BILITOT 0.4 03/05/2012 1212       Lab Results  Component Value Date   LABCA2 21 05/14/2011   LABCA2 21 05/14/2011    No components found with this basename: [id number]    No results found for this basename: INR:1;PROTIME:1 in the last 168 hours  Urinalysis No results found for this basename: colorurine, appearanceur, labspec, phurine, glucoseu, hgbur, bilirubinur, ketonesur, proteinur, urobilinogen, nitrite, leukocytesur    STUDIES: Mammography June 2013 was unremarkable  ASSESSMENT: 61 y.o. [place] woman status post right lumpectomy and axillary lymph node dissection in April 2002 for a T2 N1, stage IIB invasive ductal carcinoma treated adjuvantly with cyclophosphamide, Adriamycin and 5-fluorouracil x6 then  briefly on Arimidex with poor tolerance, then on tamoxifen between 2003 and 2008 at which point she started and anastrozole, continued to July of 2013.Marland Kitchen   PLAN: We don't have data to suggest that more than 10 years of antiestrogen therapy, particularly if think that includes 5 years of an aromatase inhibitor, is beneficial. Accordingly I am quite comfortable releasing her back to her primary care physician's care. Of course we will be glad to see Trenda at any point in the future if any work her eyes.  I think the reason she gets cellulitis in the right breast is because of interrupted lymphatic flow she needs to be  very careful not to traumatize that breast. As far as his swallowing is concerned, I think she would benefit from metoclopramide 5 mg before meals I went ahead and wrote her a prescription.   Aditi Rovira C    03/12/2012

## 2012-06-29 ENCOUNTER — Other Ambulatory Visit: Payer: Self-pay | Admitting: Oncology

## 2012-07-07 ENCOUNTER — Other Ambulatory Visit: Payer: Self-pay | Admitting: Oncology

## 2012-07-15 ENCOUNTER — Other Ambulatory Visit: Payer: Self-pay | Admitting: Oncology

## 2012-10-10 ENCOUNTER — Other Ambulatory Visit: Payer: Self-pay

## 2013-05-28 ENCOUNTER — Other Ambulatory Visit: Payer: Self-pay | Admitting: *Deleted

## 2013-05-28 MED ORDER — OMEPRAZOLE 40 MG PO CPDR: 40.0000 mg | DELAYED_RELEASE_CAPSULE | Freq: Every day | ORAL | Status: DC

## 2013-06-07 ENCOUNTER — Encounter: Payer: Self-pay | Admitting: Family

## 2013-06-07 ENCOUNTER — Other Ambulatory Visit: Payer: Self-pay | Admitting: Family

## 2013-06-07 ENCOUNTER — Ambulatory Visit (INDEPENDENT_AMBULATORY_CARE_PROVIDER_SITE_OTHER): Payer: 59 | Admitting: Family

## 2013-06-07 VITALS — BP 118/80 | HR 73 | Temp 98.3°F | Resp 16 | Ht 66.5 in | Wt 234.1 lb

## 2013-06-07 DIAGNOSIS — R7301 Impaired fasting glucose: Secondary | ICD-10-CM

## 2013-06-07 DIAGNOSIS — Z0000 Encounter for general adult medical examination without abnormal findings: Secondary | ICD-10-CM

## 2013-06-07 DIAGNOSIS — M1990 Unspecified osteoarthritis, unspecified site: Secondary | ICD-10-CM

## 2013-06-07 DIAGNOSIS — C50919 Malignant neoplasm of unspecified site of unspecified female breast: Secondary | ICD-10-CM

## 2013-06-07 DIAGNOSIS — R1319 Other dysphagia: Secondary | ICD-10-CM

## 2013-06-07 DIAGNOSIS — C50911 Malignant neoplasm of unspecified site of right female breast: Secondary | ICD-10-CM

## 2013-06-07 DIAGNOSIS — K219 Gastro-esophageal reflux disease without esophagitis: Secondary | ICD-10-CM

## 2013-06-07 DIAGNOSIS — E785 Hyperlipidemia, unspecified: Secondary | ICD-10-CM

## 2013-06-07 LAB — CBC WITH DIFFERENTIAL/PLATELET
Basophils Relative: 1 % (ref 0–1)
Eosinophils Absolute: 0.4 10*3/uL (ref 0.0–0.7)
Eosinophils Relative: 5 % (ref 0–5)
HCT: 40.4 % (ref 36.0–46.0)
Hemoglobin: 13.4 g/dL (ref 12.0–15.0)
MCH: 23.8 pg — ABNORMAL LOW (ref 26.0–34.0)
MCHC: 33.2 g/dL (ref 30.0–36.0)
MCV: 71.6 fL — ABNORMAL LOW (ref 78.0–100.0)
Monocytes Absolute: 0.6 10*3/uL (ref 0.1–1.0)
Monocytes Relative: 8 % (ref 3–12)
Neutrophils Relative %: 45 % (ref 43–77)
RDW: 15.3 % (ref 11.5–15.5)

## 2013-06-07 NOTE — Progress Notes (Signed)
Subjective:    Patient ID: Elizabeth Vang, female    DOB: 1950/09/15, 63 y.o.   MRN: [id number]  HPI  Ms. Elizabeth Vang ids a 62 yr old female who presents today to establish care. Pmhx is significant for   1) Breast Cancer- diagnosed in 2002 and followed by Magrinat.  2) OA- She continues to have joint issues.  L hip, bilateral ankles.  Glucosamine and aleve tend to help.  Trying to exercise regularly (walking/aerobics).  3) GERD- takes omeprazole which helps.  Reports occasional dysphagia symptoms.  Worse when her GERD is uncontrolled. She reports that she has never had endoscopy.  The patient is fasting today and would like to complete fasting laboratories.   Review of Systems  Constitutional: Negative for unexpected weight change.  HENT: Negative for rhinorrhea.   Respiratory: Negative for cough and shortness of breath.   Cardiovascular: Negative for leg swelling.  Gastrointestinal: Negative for nausea, diarrhea, blood in stool and anal bleeding.       Frequent gerd symptoms  Reports some issues with dysphagia.  Declines referral to GI  Genitourinary: Negative for dysuria, frequency and hematuria.  Musculoskeletal: Positive for joint swelling.       Reports that she uses aleve 1-2 tabs a day.  Reports some right sided tenderness overlying lateral rib cage.    Skin: Negative for rash.       Reports occasional eczema  Neurological: Negative for headaches.  Hematological: Negative for adenopathy.  Psychiatric/Behavioral:       Denies depression/anxiety   Past Medical History  Diagnosis Date  . Cancer 12/12/2000    breast  . Hyperlipidemia     History   Social History  . Marital Status: Married    Spouse Name: N/A    Number of Children: N/A  . Years of Education: N/A   Occupational History  . Not on file.   Social History Main Topics  . Smoking status: Never Smoker   . Smokeless tobacco: Never Used  . Alcohol Use: Yes     Comment: rarely drinks wine  . Drug  Use: Not on file  . Sexual Activity: Not on file   Other Topics Concern  . 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 Ganglion cyst excision Right 1970    wrist  . Appendectomy  1982  . Laparoscopic salpingo oopherectomy Left 1984    benign tumor  . Breast surgery Right 12/12/00    lumpectomy / axillary node dissection, sentinel node bx (cancer)    Family History  Problem Relation Age of Onset  . Hypertension Mother   . Stroke Mother   . Cancer Mother 73    uterine  . Heart disease Mother     CHF  . Diabetes Father   . Lymphoma Father     Allergies  Allergen Reactions  . Oxycodone-Acetaminophen     hallucinations    Current Outpatient Prescriptions on File Prior to Visit  Medication Sig Dispense Refill  . omeprazole (PRILOSEC) 40 MG capsule Take 1 capsule (40 mg total) by mouth daily.  30 capsule  12  . metoCLOPramide (REGLAN) 10 MG tablet Take 0.5 tablets (5 mg total) by mouth 3 (three) times daily before  meals.  90 tablet  12   No current facility-administered medications on file prior to visit.    BP 118/80  Pulse 73  Temp(Src) 98.3 F (36.8 C) (Oral)  Resp 16  Ht 5' 6.5" (1.689 m)  Wt 234 lb 1.3 oz (106.178 kg)  BMI 37.22 kg/m2  SpO2 99%        Objective:   Physical Exam  Constitutional: She is oriented to person, place, and time. She appears well-developed and well-nourished. No distress.  HENT:  Head: Normocephalic and atraumatic.  Cardiovascular: Normal rate and regular rhythm.   No murmur heard. Pulmonary/Chest: Effort normal and breath sounds normal. No respiratory distress. She has no wheezes. She has no rales. She exhibits no tenderness.  Abdominal: Soft. Bowel sounds are normal.  Musculoskeletal: She exhibits no edema.   Neurological: She is alert and oriented to person, place, and time.  Skin: Skin is warm and dry.  Psychiatric: She has a normal mood and affect. Her behavior is normal. Judgment and thought content normal.          Assessment & Plan:

## 2013-06-07 NOTE — Assessment & Plan Note (Signed)
Stable on PPI. Continue same.  

## 2013-06-07 NOTE — Assessment & Plan Note (Signed)
Has had some elevated blood sugars back in 2012.  She reports that she has taken steroids on several occasions due to breast inflammation.  Will check bmet today. If sugar is elevated, will plan to check A1C.

## 2013-06-07 NOTE — Assessment & Plan Note (Signed)
Check flp today.

## 2013-06-07 NOTE — Patient Instructions (Signed)
Please schedule a fasting physical at the front desk.  

## 2013-06-07 NOTE — Assessment & Plan Note (Signed)
Discussed ortho referral. She declines. We also discussed trying to limit use of aleve and instead substitute tylenol if able due to increased stress on kidneys with nsaids and gi side efffects.  Advised pt to keep tylenol 3000mg  a day.

## 2013-06-07 NOTE — Assessment & Plan Note (Signed)
We discussed that sometimes dysphagia can be caused by an esphageal stricture. Discussed referral to GI. She declines at this time.

## 2013-06-07 NOTE — Assessment & Plan Note (Signed)
Clinically stable. She has been released from oncology.  She will follow up in 2 weeks for cpx.  Plan to oreder screening mammo next visit.

## 2013-06-08 ENCOUNTER — Other Ambulatory Visit: Payer: Self-pay

## 2013-06-08 DIAGNOSIS — Z1231 Encounter for screening mammogram for malignant neoplasm of breast: Secondary | ICD-10-CM

## 2013-06-08 DIAGNOSIS — Z9889 Other specified postprocedural states: Secondary | ICD-10-CM

## 2013-06-08 DIAGNOSIS — Z853 Personal history of malignant neoplasm of breast: Secondary | ICD-10-CM

## 2013-06-08 LAB — IRON: Iron: 148 ug/dL — ABNORMAL HIGH (ref 42–145)

## 2013-06-08 LAB — HEPATIC FUNCTION PANEL
ALT: 26 U/L (ref 0–35)
AST: 27 U/L (ref 0–37)
Albumin: 4.4 g/dL (ref 3.5–5.2)
Alkaline Phosphatase: 78 U/L (ref 39–117)
Total Protein: 7.8 g/dL (ref 6.0–8.3)

## 2013-06-08 LAB — URINALYSIS, ROUTINE W REFLEX MICROSCOPIC
Glucose, UA: NEGATIVE mg/dL
Hgb urine dipstick: NEGATIVE
Ketones, ur: NEGATIVE mg/dL
Leukocytes, UA: NEGATIVE
Nitrite: NEGATIVE
Protein, ur: NEGATIVE mg/dL
Urobilinogen, UA: 1 mg/dL (ref 0.0–1.0)
pH: 7 (ref 5.0–8.0)

## 2013-06-08 LAB — LIPID PANEL
Cholesterol: 249 mg/dL — ABNORMAL HIGH (ref 0–200)
HDL: 39 mg/dL — ABNORMAL LOW (ref >39)
LDL Cholesterol: 185 mg/dL — ABNORMAL HIGH (ref 0–99)

## 2013-06-08 LAB — BASIC METABOLIC PANEL WITH GFR
BUN: 12 mg/dL (ref 6–23)
CO2: 28 mEq/L (ref 19–32)
Calcium: 9.7 mg/dL (ref 8.4–10.5)
GFR, Est African American: >89 mL/min
Glucose, Bld: 99 mg/dL (ref 70–99)
Potassium: 4.3 mEq/L (ref 3.5–5.3)

## 2013-06-08 LAB — TSH: TSH: 2.297 u[IU]/mL (ref 0.350–4.500)

## 2013-06-10 ENCOUNTER — Other Ambulatory Visit: Payer: Self-pay | Admitting: Family

## 2013-06-10 NOTE — Telephone Encounter (Signed)
Please call pt and let her know that cholesterol is high.  LDL is 185.  Comparing to old lipid on file, it was 185 four years ago as well.  I would recommend addition of atorvastatin 20mg  once daily.  Repeat FLP/LFT in 6 weeks.  Call if she develops unusual muscle pain. Other labs look good.

## 2013-06-15 ENCOUNTER — Encounter: Payer: Self-pay | Admitting: Family

## 2013-06-15 ENCOUNTER — Ambulatory Visit (INDEPENDENT_AMBULATORY_CARE_PROVIDER_SITE_OTHER): Payer: 59 | Admitting: Family

## 2013-06-15 ENCOUNTER — Other Ambulatory Visit (HOSPITAL_COMMUNITY)
Admission: RE | Admit: 2013-06-15 | Discharge: 2013-06-15 | Disposition: A | Discharge status: Home / Self Care | Payer: 59 | Source: Ambulatory Visit | Attending: Family | Admitting: Family

## 2013-06-15 VITALS — BP 152/90 | HR 83 | Temp 98.3°F | Resp 16 | Ht 66.5 in | Wt 236.1 lb

## 2013-06-15 DIAGNOSIS — Z01419 Encounter for gynecological examination (general) (routine) without abnormal findings: Secondary | ICD-10-CM | POA: Insufficient documentation

## 2013-06-15 DIAGNOSIS — E785 Hyperlipidemia, unspecified: Secondary | ICD-10-CM

## 2013-06-15 DIAGNOSIS — Z1151 Encounter for screening for human papillomavirus (HPV): Secondary | ICD-10-CM | POA: Insufficient documentation

## 2013-06-15 DIAGNOSIS — I10 Essential (primary) hypertension: Secondary | ICD-10-CM

## 2013-06-15 DIAGNOSIS — Z0000 Encounter for general adult medical examination without abnormal findings: Secondary | ICD-10-CM | POA: Insufficient documentation

## 2013-06-15 MED ORDER — ATORVASTATIN CALCIUM 20 MG PO TABS: 20.0000 mg | ORAL_TABLET | Freq: Every day | ORAL | Status: DC

## 2013-06-15 NOTE — Telephone Encounter (Signed)
Left message to return my call on home #. 

## 2013-06-15 NOTE — Assessment & Plan Note (Signed)
We discussed risks/benefits of statins. She is agreeable to proceed with starting atorvastatin.

## 2013-06-15 NOTE — Assessment & Plan Note (Signed)
Pt to complete mammogram as scheduled.  Discussed healthy diet, exercise, weight loss.  Declines dexa. Pap performed today. She is given stool kit for IFOB to complete. Plan colo next visit.

## 2013-06-15 NOTE — Patient Instructions (Signed)
Please go to lab at [facility] in 6 weeks for fasting cholesterol and liver testing. Continue to work on healthy low fat diet, exercise and weight loss. Please follow up in 6 months for office visit.

## 2013-06-15 NOTE — Progress Notes (Signed)
Subjective:    Patient ID: Elizabeth Vang, female    DOB: 01-Mar-1951, 62 y.o.   MRN: [id number]  HPI  Patient presents today for complete physical.  Immunizations: reports tetanus is current but she is not sure of the exact date Diet: reports healthy diet Exercise:  Reports not exercising regularly Colonoscopy: reports around 2005 Dexa: reports osteopenia 2012, declines dexa Pap Smear: due  Mammogram: scheduled 06/28/13  Hyperlipidemia- she was noted on fasting labs to have hyperlipidemia.  Is concerned about starting statin.  Review of Systems  Constitutional:       Some weight gain  HENT: Negative for hearing loss and rhinorrhea.   Eyes: Negative for visual disturbance.  Respiratory: Negative for cough and shortness of breath.   Cardiovascular: Negative for chest pain.  Gastrointestinal:       Reports some constipation- uses daily stool softners.  Has BM every 3 day  Genitourinary: Negative for dysuria and frequency.  Musculoskeletal:       Reports left hip ankles, knee pain  Neurological: Negative for headaches.  Hematological: Negative for adenopathy.  Psychiatric/Behavioral:       Denies depression/anxiety   Past Medical History  Diagnosis Date  . Cancer 12/12/2000    breast  . Hyperlipidemia     History   Social History  . Marital Status: Married    Spouse Name: N/A    Number of Children: N/A  . Years of Education: N/A   Occupational History  . Not on file.   Social History Main Topics  . Smoking status: Never Smoker   . Smokeless tobacco: Never Used  . Alcohol Use: Yes     Comment: rarely drinks wine  . Drug Use: Not on file  . Sexual Activity: Not on file   Other Topics Concern  . 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  Ganglion cyst excision Right 1970    wrist  . Appendectomy  1982  . Laparoscopic salpingo oopherectomy Left 1984    benign tumor  . Breast surgery Right 12/12/00    lumpectomy / axillary node dissection, sentinel node bx (cancer)    Family History  Problem Relation Age of Onset  . Hypertension Mother   . Stroke Mother   . Cancer Mother 61    uterine  . Heart disease Mother     CHF  . Diabetes Father   . Lymphoma Father     Allergies  Allergen Reactions  . Oxycodone-Acetaminophen     hallucinations    Current Outpatient Prescriptions on File Prior to Visit  Medication Sig Dispense Refill  . CALCIUM-MAGNESIUM-VITAMIN D PO Take 1 tablet by mouth daily.      Marland Kitchen docusate sodium (COLACE) 100 MG capsule Take 100 mg by mouth 2 (two) times daily.      . Glucosamine HCl 1500 MG TABS Take 1 tablet by mouth daily.      Marland Kitchen loratadine (CLARITIN) 10 MG tablet Take 10 mg by mouth daily.      . naproxen sodium (ALEVE) 220 MG tablet Take 220 mg by mouth 2 (two) times daily with a meal.      . Omega-3 Fatty Acids (OMEGA 3 PO) Take 1 capsule by  mouth daily.      Marland Kitchen omeprazole (PRILOSEC) 40 MG capsule Take 1 capsule (40 mg total) by mouth daily.  30 capsule  12  . OVER THE COUNTER MEDICATION Take 500 mg by mouth daily. VITAMIN C /  ROSEHIPS      . Red Yeast Rice 600 MG CAPS Take 1 capsule by mouth daily.      . vitamin E 400 UNIT capsule Take 400 Units by mouth daily.       No current facility-administered medications on file prior to visit.    BP 152/90  Pulse 83  Temp(Src) 98.3 F (36.8 C) (Oral)  Resp 16  Ht 5' 6.5" (1.689 m)  Wt 236 lb 1.3 oz (107.085 kg)  BMI 37.54 kg/m2  SpO2 97%       Objective:   Physical Exam  Physical Exam  Constitutional: Pleasant, overweight AA female in NAD. She is oriented to person, place, and time. She appears well-developed and well-nourished. No distress.  HENT:  Head: Normocephalic and atraumatic.  Right Ear: Tympanic membrane and ear canal  normal.  Left Ear: Tympanic membrane and ear canal normal.  Mouth/Throat: Oropharynx is clear and moist.  Eyes: Pupils are equal, round, and reactive to light. No scleral icterus.  Neck: Normal range of motion. No thyromegaly present.  Cardiovascular: Normal rate and regular rhythm.   No murmur heard. Pulmonary/Chest: Effort normal and breath sounds normal. No respiratory distress. He has no wheezes. She has no rales. She exhibits no tenderness.  Abdominal: Soft. Bowel sounds are normal. He exhibits no distension and no mass. There is no tenderness. There is no rebound and no guarding.  Musculoskeletal: She exhibits no edema.  Lymphadenopathy:    She has no cervical adenopathy.  Neurological: She is alert and oriented to person, place, and time. She has diminished patellar reflexes. She exhibits normal muscle tone. Coordination normal.  Skin: Skin is warm and dry.  Psychiatric: She has a normal mood and affect. Her behavior is normal. Judgment and thought content normal.  Breasts: Examined lying Right:  + thickening at former site of lumpectomy. Otherwise normal breast.  Left: Without masses, retractions, discharge or axillary adenopathy.  Inguinal/mons: Normal without inguinal adenopathy  External genitalia: Normal  BUS/Urethra/Skene's glands: Normal  Bladder: Normal  Vagina: Normal  Cervix: Normal (pap performed) Uterus: normal in size, shape and contour. Midline and mobile  Adnexa/parametria:  Rt: Without masses or tenderness.  Lt: surgically absent Anus and perineum: Normal           Assessment & Plan:         Assessment & Plan:

## 2013-06-15 NOTE — Telephone Encounter (Signed)
Notified pt. She declines medication at this time. States she will discuss this at her follow up today.

## 2013-06-15 NOTE — Assessment & Plan Note (Signed)
3 of the last 4 blood pressure readings have been in hypertensive range.  EKG today consistent with LVH.  Advised pt that she would benefit medically from the addition of antihypertensive agent. She declines at this time. Instead, she wishes to work on low sodium diet, exercise and weight loss and to monitor hr blood pressure more closely at home.  I have advised her to bring readings to her follow up appointment and to follow up in 3 months.

## 2013-06-18 ENCOUNTER — Encounter: Payer: Self-pay | Admitting: Family

## 2013-06-22 ENCOUNTER — Telehealth: Payer: Self-pay | Admitting: *Deleted

## 2013-06-22 DIAGNOSIS — E785 Hyperlipidemia, unspecified: Secondary | ICD-10-CM

## 2013-06-22 NOTE — Telephone Encounter (Signed)
Message copied by Kathi Simpers on Tue Jun 22, 2013  2:52 PM ------      Message from: O'SULLIVAN, MELISSA      Created: Tue Jun 15, 2013 11:58 AM       Pt will go to [facility] lab in 6 weeks for FLP/LFT dx hyperlipidemia please.  ------

## 2013-06-22 NOTE — Telephone Encounter (Signed)
Order entered

## 2013-06-28 ENCOUNTER — Ambulatory Visit
Admission: RE | Admit: 2013-06-28 | Discharge: 2013-06-28 | Disposition: A | Discharge status: Home / Self Care | Payer: 59 | Source: Ambulatory Visit

## 2013-06-28 DIAGNOSIS — Z9889 Other specified postprocedural states: Secondary | ICD-10-CM

## 2013-06-28 DIAGNOSIS — Z1231 Encounter for screening mammogram for malignant neoplasm of breast: Secondary | ICD-10-CM

## 2013-06-28 DIAGNOSIS — Z853 Personal history of malignant neoplasm of breast: Secondary | ICD-10-CM

## 2013-07-02 ENCOUNTER — Other Ambulatory Visit (INDEPENDENT_AMBULATORY_CARE_PROVIDER_SITE_OTHER): Payer: 59

## 2013-07-02 DIAGNOSIS — Z0000 Encounter for general adult medical examination without abnormal findings: Secondary | ICD-10-CM

## 2013-07-05 ENCOUNTER — Encounter: Payer: Self-pay | Admitting: Family

## 2013-08-03 ENCOUNTER — Other Ambulatory Visit (INDEPENDENT_AMBULATORY_CARE_PROVIDER_SITE_OTHER): Payer: 59

## 2013-08-03 ENCOUNTER — Encounter: Payer: Self-pay | Admitting: Family

## 2013-08-03 DIAGNOSIS — E785 Hyperlipidemia, unspecified: Secondary | ICD-10-CM

## 2013-08-03 LAB — HEPATIC FUNCTION PANEL
ALT: 28 U/L (ref 0–35)
Albumin: 4.1 g/dL (ref 3.5–5.2)
Total Protein: 7.9 g/dL (ref 6.0–8.3)

## 2013-08-03 LAB — LIPID PANEL
Cholesterol: 165 mg/dL (ref 0–200)
HDL: 40 mg/dL (ref >39.00)
LDL Cholesterol: 106 mg/dL — ABNORMAL HIGH (ref 0–99)
Triglycerides: 95 mg/dL (ref 0.0–149.0)
VLDL: 19 mg/dL (ref 0.0–40.0)

## 2013-09-13 ENCOUNTER — Encounter: Payer: Self-pay | Admitting: Family

## 2013-09-13 ENCOUNTER — Ambulatory Visit (INDEPENDENT_AMBULATORY_CARE_PROVIDER_SITE_OTHER): Payer: 59 | Admitting: Family

## 2013-09-13 VITALS — BP 130/94 | HR 76 | Temp 98.8°F | Resp 16 | Ht 66.5 in | Wt 236.0 lb

## 2013-09-13 DIAGNOSIS — I10 Essential (primary) hypertension: Secondary | ICD-10-CM

## 2013-09-13 DIAGNOSIS — E785 Hyperlipidemia, unspecified: Secondary | ICD-10-CM

## 2013-09-13 NOTE — Patient Instructions (Addendum)
Continue working on low salt diet daily and 30 minutes of exercise daily for 5 days a week. Follow up in three months for blood pressure recheck.

## 2013-09-13 NOTE — Assessment & Plan Note (Addendum)
BP today 132/88- upon manual repeat.  Home BP readings stable. Continue working on  low sodium diet and regular exercise. Follow up in 3 months for office recheck.

## 2013-09-13 NOTE — Progress Notes (Signed)
Subjective:    Patient ID: Elizabeth Vang, female    DOB: May 10, 1951, 63 y.o.   MRN: [id number]  Hypertension Pertinent negatives include no chest pain, headaches or shortness of breath.  Hyperlipidemia Pertinent negatives include no chest pain, myalgias or shortness of breath.   Elizabeth Vang is a 63 year old female who presents today for 3 month follow up.  Elevated Blood Pressure:  BP Readings from Last 3 Encounters:  09/13/13 130/94  06/15/13 152/90  06/07/13 118/80   Blood pressure noted to be elevated during past visits. Patient declined antihypertensive medication during last visit and is now interested in starting a medication. Patient reports healthy diet and minimal exercise.  Denies chest pain and shortness of breath.  Patient reports checking pressure at ome and getting 130's/80's to 90's.  Hyperlipidemia: Patient was started on atorvastatin 20mg  during last visit for elevated LDL's in October 2014. LDL was within acceptable range when measured in December 2014.  Patient compliant with atorvastatin. Denies myalgias.   Review of Systems  Constitutional: Negative for activity change.  HENT: Negative for rhinorrhea.   Respiratory: Negative for cough and shortness of breath.   Cardiovascular: Negative for chest pain.  Musculoskeletal: Positive for arthralgias. Negative for myalgias.       Patient reports long history of joint pain post chemotherapy in 2002.  Neurological: Negative for dizziness, light-headedness and headaches.   Past Medical History  Diagnosis Date  . Cancer 12/12/2000    breast  . Hyperlipidemia     History   Social History  . Marital Status: Married    Spouse Name: N/A    Number of Children: N/A  . Years of Education: N/A   Occupational History  . Not on file.   Social History Main Topics  . Smoking status: Never Smoker   . Smokeless tobacco: Never Used  . Alcohol Use: Yes     Comment: rarely drinks wine  . Drug Use: Not on file    . Sexual Activity: Not on file   Other Topics Concern  . 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 Ganglion cyst excision Right 1970    wrist  . Appendectomy  1982  . Laparoscopic salpingo oopherectomy Left 1984    benign tumor  . Breast surgery Right 12/12/00    lumpectomy / axillary node dissection, sentinel node bx (cancer)    Family History  Problem Relation Age of Onset  . Hypertension Mother   . Stroke Mother   . Cancer Mother 50    uterine  . Heart disease Mother     CHF  . Diabetes Father   . Lymphoma Father     Allergies  Allergen Reactions  . Oxycodone-Acetaminophen     hallucinations    Current Outpatient Prescriptions on File Prior to Visit  Medication Sig Dispense Refill  . atorvastatin (LIPITOR) 20 MG tablet Take 1 tablet (20 mg total) by mouth daily.  30 tablet  2  . CALCIUM-MAGNESIUM-VITAMIN D PO Take 1 tablet by mouth daily.      Marland Kitchen docusate sodium (COLACE) 100 MG capsule Take 100 mg by mouth 2 (two) times daily.      . Glucosamine HCl 1500 MG  TABS Take 1 tablet by mouth daily.      Marland Kitchen loratadine (CLARITIN) 10 MG tablet Take 10 mg by mouth daily.      . naproxen sodium (ALEVE) 220 MG tablet Take 220 mg by mouth 2 (two) times daily with a meal.      . Omega-3 Fatty Acids (OMEGA 3 PO) Take 1 capsule by mouth daily.      Marland Kitchen omeprazole (PRILOSEC) 40 MG capsule Take 1 capsule (40 mg total) by mouth daily.  30 capsule  12  . OVER THE COUNTER MEDICATION Take 500 mg by mouth daily. VITAMIN C /  ROSEHIPS      . vitamin E 400 UNIT capsule Take 400 Units by mouth daily.      . Red Yeast Rice 600 MG CAPS Take 1 capsule by mouth daily.       No current facility-administered medications on file prior to visit.    BP 130/94  Pulse 76  Temp(Src)  98.8 F (37.1 C) (Oral)  Resp 16  Ht 5' 6.5" (1.689 m)  Wt 236 lb 0.6 oz (107.067 kg)  BMI 37.53 kg/m2  SpO2 99%       Objective:   Physical Exam  Constitutional: She is oriented to person, place, and time. She appears well-nourished.  HENT:  Head: Normocephalic.  Cardiovascular: Normal rate, regular rhythm, normal heart sounds and intact distal pulses.   Pulmonary/Chest: Breath sounds normal. No respiratory distress.  Musculoskeletal: She exhibits no edema.  Neurological: She is alert and oriented to person, place, and time.  Skin: Skin is warm and dry.  Psychiatric: She has a normal mood and affect.          Assessment & Plan:  I have personally seen and examined patient and agree with Jerrel Ivory NP student's assessment and plan.

## 2013-09-13 NOTE — Assessment & Plan Note (Signed)
Stable with atorvastatin without myalgias.  Recheck lipid panel in 6 months.

## 2013-09-13 NOTE — Progress Notes (Signed)
Pre visit review using our clinic review tool, if applicable. No additional management support is needed unless otherwise documented below in the visit note. 

## 2013-09-17 LAB — RUBEOLA ANTIBODY IGG

## 2013-09-17 LAB — VARICELLA ZOSTER ANTIBODY, IGG: VARICELLA: 1176

## 2013-09-17 LAB — CHG RUBELLA: Rubella Antibodies, IGG: 17.5

## 2013-09-17 LAB — HEPATITIS B SURFACE ANTIBODY,QUALITATIVE: Hep B S Ab: POSITIVE

## 2013-09-17 LAB — CHG MUMPS: Mumps IgG: >300

## 2013-09-27 ENCOUNTER — Other Ambulatory Visit: Payer: Self-pay | Admitting: Family

## 2013-09-29 ENCOUNTER — Telehealth: Payer: Self-pay | Admitting: Family

## 2013-09-29 NOTE — Telephone Encounter (Signed)
Relevant patient education mailed to patient.  

## 2013-12-14 ENCOUNTER — Ambulatory Visit (INDEPENDENT_AMBULATORY_CARE_PROVIDER_SITE_OTHER): Payer: 59 | Admitting: Family

## 2013-12-14 ENCOUNTER — Encounter: Payer: Self-pay | Admitting: Family

## 2013-12-14 VITALS — BP 128/82 | HR 77 | Temp 98.4°F | Resp 16 | Ht 66.5 in | Wt 236.0 lb

## 2013-12-14 DIAGNOSIS — I10 Essential (primary) hypertension: Secondary | ICD-10-CM

## 2013-12-14 DIAGNOSIS — K219 Gastro-esophageal reflux disease without esophagitis: Secondary | ICD-10-CM

## 2013-12-14 MED ORDER — ATORVASTATIN CALCIUM 20 MG PO TABS: ORAL_TABLET | ORAL | Status: DC

## 2013-12-14 MED ORDER — OMEPRAZOLE 40 MG PO CPDR: 40.0000 mg | DELAYED_RELEASE_CAPSULE | Freq: Every day | ORAL | Status: DC

## 2013-12-14 NOTE — Assessment & Plan Note (Signed)
BP is stable Continue low sodium diet and exercise.

## 2013-12-14 NOTE — Assessment & Plan Note (Signed)
Stable on PPI 

## 2013-12-14 NOTE — Patient Instructions (Signed)
Please schedule a follow up appointment in 6 months.   

## 2013-12-14 NOTE — Progress Notes (Signed)
Subjective:    Patient ID: Elizabeth Vang, female    DOB: [birth year], 63 y.o.   MRN: [id number]  HPI  Elizabeth Vang is a 63 yr old female who presents today for follow up of her hypertension. She is not currently on BP meds. Denies CP/SOB BP Readings from Last 3 Encounters:  12/14/13 128/82  09/13/13 130/94  06/15/13 152/90   GERD- reports that reflux is stable on prilosec.  Notes that symptoms return if she stops taking.    Review of Systems See HPI  Past Medical History  Diagnosis Date  . Cancer 12/12/2000    breast  . Hyperlipidemia     History   Social History  . Marital Status: Married    Spouse Name: N/A    Number of Children: N/A  . Years of Education: N/A   Occupational History  . Not on file.   Social History Main Topics  . Smoking status: Never Smoker   . Smokeless tobacco: Never Used  . Alcohol Use: Yes     Comment: rarely drinks wine  . Drug Use: Not on file  . Sexual Activity: Not on file   Other Topics Concern  . 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 Ganglion cyst excision Right 1970    wrist  . Appendectomy  1982  . Laparoscopic salpingo oopherectomy Left 1984    benign tumor  . Breast surgery Right 12/12/00    lumpectomy / axillary node dissection, sentinel node bx (cancer)    Family History  Problem Relation Age of Onset  . Hypertension Mother   . Stroke Mother   . Cancer Mother 34    uterine  . Heart disease Mother     CHF  . Diabetes Father   . Lymphoma Father     Allergies  Allergen Reactions  . Oxycodone-Acetaminophen     hallucinations    Current Outpatient Prescriptions on File Prior to Visit  Medication Sig Dispense Refill  . atorvastatin (LIPITOR) 20 MG tablet TAKE 1 TABLET BY MOUTH ONCE  DAILY  30 tablet  3  . CALCIUM-MAGNESIUM-VITAMIN D PO Take 1 tablet by mouth daily.      Marland Kitchen docusate sodium (COLACE) 100 MG capsule Take 100 mg by mouth 2 (two) times daily.      . Glucosamine HCl 1500 MG TABS Take 1 tablet by mouth daily.      Marland Kitchen loratadine (CLARITIN) 10 MG tablet Take 10 mg by mouth daily.      . naproxen sodium (ALEVE) 220 MG tablet Take 220 mg by mouth 2 (two) times daily with a meal.      . Omega-3 Fatty Acids (OMEGA 3 PO) Take 1 capsule by mouth daily.      Marland Kitchen omeprazole (PRILOSEC) 40 MG capsule Take 1 capsule (40 mg total) by mouth daily.  30 capsule  12  . OVER THE COUNTER MEDICATION Take 500 mg by mouth daily. VITAMIN C /  ROSEHIPS      . vitamin E 400 UNIT capsule Take 400 Units by mouth daily.      . Red Yeast Rice 600 MG CAPS Take 1 capsule by mouth daily.       No  current facility-administered medications on file prior to visit.    BP 128/82  Pulse 77  Temp(Src) 98.4 F (36.9 C) (Oral)  Resp 16  Ht 5' 6.5" (1.689 m)  Wt 236 lb (107.049 kg)  BMI 37.53 kg/m2  SpO2 97%       Objective:   Physical Exam  Constitutional: She is oriented to person, place, and time. She appears well-developed and well-nourished. No distress.  HENT:  Head: Normocephalic and atraumatic.  Cardiovascular: Normal rate and regular rhythm.   No murmur heard. Pulmonary/Chest: Effort normal and breath sounds normal. No respiratory distress. She has no wheezes. She has no rales. She exhibits no tenderness.  Neurological: She is alert and oriented to person, place, and time.  Psychiatric: She has a normal mood and affect. Her behavior is normal. Judgment and thought content normal.          Assessment & Plan:  Discussed diet/exercise and weight loss today.

## 2013-12-14 NOTE — Progress Notes (Signed)
Pre visit review using our clinic review tool, if applicable. No additional management support is needed unless otherwise documented below in the visit note. 

## 2014-06-17 ENCOUNTER — Ambulatory Visit (INDEPENDENT_AMBULATORY_CARE_PROVIDER_SITE_OTHER): Payer: 59 | Admitting: Family

## 2014-06-17 ENCOUNTER — Encounter: Payer: Self-pay | Admitting: Family

## 2014-06-17 VITALS — BP 146/96 | HR 89 | Temp 98.3°F | Resp 16 | Ht 66.5 in | Wt 236.8 lb

## 2014-06-17 DIAGNOSIS — I10 Essential (primary) hypertension: Secondary | ICD-10-CM

## 2014-06-17 DIAGNOSIS — E785 Hyperlipidemia, unspecified: Secondary | ICD-10-CM

## 2014-06-17 DIAGNOSIS — K219 Gastro-esophageal reflux disease without esophagitis: Secondary | ICD-10-CM

## 2014-06-17 MED ORDER — HYDROCHLOROTHIAZIDE 25 MG PO TABS: 25.0000 mg | ORAL_TABLET | Freq: Every day | ORAL | Status: DC

## 2014-06-17 NOTE — Progress Notes (Signed)
Pre visit review using our clinic review tool, if applicable. No additional management support is needed unless otherwise documented below in the visit note. 

## 2014-06-17 NOTE — Assessment & Plan Note (Signed)
Uncontrolled. She is now agreeable to start an antihypertensive.  Add hctz, follow up in 2 weeks for BP recheck and bmet. Discussed low sodium diet and resuming exercise.

## 2014-06-17 NOTE — Assessment & Plan Note (Signed)
Stable on PPI. Continue same.  

## 2014-06-17 NOTE — Patient Instructions (Signed)
Start HCTZ once daily in the morning for blood pressure. Continue to limit sodium in your diet and try to get regular exercise. Follow up in 2 weeks.

## 2014-06-17 NOTE — Assessment & Plan Note (Signed)
Plan flp next visit.

## 2014-06-17 NOTE — Progress Notes (Signed)
Subjective:    Patient ID: Edward Qualia, female    DOB: 1951-08-11, 63 y.o.   MRN: [id number]  HPI  Ms. Ruda is a 63 yr old female who presents today for follow up.  1) HTN- maintained on diet alone. Admits that she has not been exercising as she once was.  BP Readings from Last 3 Encounters:  06/17/14 146/96  12/14/13 128/82  09/13/13 130/94   2)GERD- maintained on prilosec 40mg  once daily. Reports that if she misses a few days then her symptoms return.    3) Hyperlipidemia-  Lab Results  Component Value Date   CHOL 165 08/03/2013   HDL 40.00 08/03/2013   LDLCALC 106* 08/03/2013   TRIG 95.0 08/03/2013   CHOLHDL 4 08/03/2013     Review of Systems See HPI  Past Medical History  Diagnosis Date  . Cancer 12/12/2000    breast  . Hyperlipidemia     History   Social History  . Marital Status: Married    Spouse Name: N/A    Number of Children: N/A  . Years of Education: N/A   Occupational History  . Not on file.   Social History Main Topics  . Smoking status: Never Smoker   . Smokeless tobacco: Never Used  . Alcohol Use: Yes     Comment: rarely drinks wine  . Drug Use: Not on file  . Sexual Activity: Not on file   Other Topics Concern  . 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 Ganglion cyst excision Right 1970    wrist  . Appendectomy  1982  . Laparoscopic salpingo oopherectomy Left 1984    benign tumor  . Breast surgery Right 12/12/00    lumpectomy / axillary node dissection, sentinel node bx (cancer)    Family History  Problem Relation Age of Onset  . Hypertension Mother   . Stroke Mother   . Cancer Mother 6    uterine  . Heart disease Mother     CHF  . Diabetes Father   . Lymphoma Father     Allergies  Allergen  Reactions  . Oxycodone-Acetaminophen     hallucinations    Current Outpatient Prescriptions on File Prior to Visit  Medication Sig Dispense Refill  . atorvastatin (LIPITOR) 20 MG tablet TAKE 1 TABLET BY MOUTH ONCE DAILY  90 tablet  1  . CALCIUM-MAGNESIUM-VITAMIN D PO Take 1 tablet by mouth daily.      Marland Kitchen docusate sodium (COLACE) 100 MG capsule Take 100 mg by mouth 2 (two) times daily.      . Glucosamine HCl 1500 MG TABS Take 1 tablet by mouth daily.      Marland Kitchen loratadine (CLARITIN) 10 MG tablet Take 10 mg by mouth daily.      . naproxen sodium (ALEVE) 220 MG tablet Take 220 mg by mouth 2 (two) times daily with a meal.      . Omega-3 Fatty Acids (OMEGA 3 PO) Take 1 capsule by mouth daily.      Marland Kitchen omeprazole (PRILOSEC) 40 MG capsule Take 1 capsule (40 mg total) by mouth daily.  90 capsule  1  . OVER THE COUNTER MEDICATION Take 500 mg by mouth daily.  VITAMIN C /  ROSEHIPS      . Red Yeast Rice 600 MG CAPS Take 1 capsule by mouth daily.      . vitamin E 400 UNIT capsule Take 400 Units by mouth daily.       No current facility-administered medications on file prior to visit.    BP 146/96  Pulse 89  Temp(Src) 98.3 F (36.8 C) (Oral)  Resp 16  Ht 5' 6.5" (1.689 m)  Wt 236 lb 12.8 oz (107.412 kg)  BMI 37.65 kg/m2  SpO2 96%       Objective:   Physical Exam  Constitutional: She is oriented to person, place, and time. She appears well-developed and well-nourished. No distress.  HENT:  Head: Normocephalic and atraumatic.  Cardiovascular: Normal rate and regular rhythm.   No murmur heard. Pulmonary/Chest: Effort normal and breath sounds normal. No respiratory distress. She has no wheezes. She has no rales. She exhibits no tenderness.  Musculoskeletal: She exhibits no edema.  Neurological: She is alert and oriented to person, place, and time.  Psychiatric: She has a normal mood and affect. Her behavior is normal. Judgment and thought content normal.          Assessment & Plan:

## 2014-07-07 ENCOUNTER — Encounter: Payer: Self-pay | Admitting: Family

## 2014-07-07 ENCOUNTER — Ambulatory Visit (INDEPENDENT_AMBULATORY_CARE_PROVIDER_SITE_OTHER): Payer: 59 | Admitting: Family

## 2014-07-07 ENCOUNTER — Telehealth: Payer: Self-pay | Admitting: Family

## 2014-07-07 VITALS — BP 129/88 | HR 76 | Temp 98.6°F | Resp 18 | Ht 66.5 in | Wt 239.0 lb

## 2014-07-07 DIAGNOSIS — I10 Essential (primary) hypertension: Secondary | ICD-10-CM

## 2014-07-07 LAB — BASIC METABOLIC PANEL
BUN: 13 mg/dL (ref 6–23)
CO2: 22 meq/L (ref 19–32)
Calcium: 9.7 mg/dL (ref 8.4–10.5)
Chloride: 100 mEq/L (ref 96–112)
Creatinine, Ser: 0.7 mg/dL (ref 0.4–1.2)
GFR: 118.18 mL/min (ref >60.00)
GLUCOSE: 180 mg/dL — AB (ref 70–99)
Potassium: 3.7 mEq/L (ref 3.5–5.1)
Sodium: 138 mEq/L (ref 135–145)

## 2014-07-07 MED ORDER — HYDROCHLOROTHIAZIDE 25 MG PO TABS: 25.0000 mg | ORAL_TABLET | Freq: Every day | ORAL | Status: DC

## 2014-07-07 MED ORDER — OMEPRAZOLE 40 MG PO CPDR: 40.0000 mg | DELAYED_RELEASE_CAPSULE | Freq: Every day | ORAL | Status: DC

## 2014-07-07 MED ORDER — ATORVASTATIN CALCIUM 20 MG PO TABS: ORAL_TABLET | ORAL | Status: DC

## 2014-07-07 NOTE — Patient Instructions (Signed)
Please complete lab work prior to leaving. Continue HCTZ, follow up in 3 months.

## 2014-07-07 NOTE — Progress Notes (Signed)
Pre visit review using our clinic review tool, if applicable. No additional management support is needed unless otherwise documented below in the visit note. 

## 2014-07-07 NOTE — Assessment & Plan Note (Signed)
Improved, continue hctz, obtain follow up bmet.

## 2014-07-07 NOTE — Progress Notes (Signed)
Subjective:    Patient ID: Elizabeth Vang, female    DOB: 04/18/1951, 62 y.o.   MRN: [id number]  HPI  [name] is a 63 yr old female who presents today for follow up.  Last visit hctz was added to her regimen. She reports tolerating medication without difficulty.  Denies CP, SOB or swelling.  BP Readings from Last 3 Encounters:  07/07/14 129/88  06/17/14 146/96  12/14/13 128/82      Review of Systems See HPI  Past Medical History  Diagnosis Date  . Cancer 12/12/2000    breast  . Hyperlipidemia     History   Social History  . Marital Status: Married    Spouse Name: N/A    Number of Children: N/A  . Years of Education: N/A   Occupational History  . Not on file.   Social History Main Topics  . Smoking status: Never Smoker   . Smokeless tobacco: Never Used  . Alcohol Use: Yes     Comment: rarely drinks wine  . Drug Use: Not on file  . Sexual Activity: Not on file   Other Topics Concern  . 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 Ganglion cyst excision Right 1970    wrist  . Appendectomy  1982  . Laparoscopic salpingo oopherectomy Left 1984    benign tumor  . Breast surgery Right 12/12/00    lumpectomy / axillary node dissection, sentinel node bx (cancer)    Family History  Problem Relation Age of Onset  . Hypertension Mother   . Stroke Mother   . Cancer Mother 69    uterine  . Heart disease Mother     CHF  . Diabetes Father   . Lymphoma Father     Allergies  Allergen Reactions  . Oxycodone-Acetaminophen     hallucinations    Current Outpatient Prescriptions on File Prior to Visit  Medication Sig Dispense Refill  . CALCIUM-MAGNESIUM-VITAMIN D PO Take 1 tablet by mouth daily.    Marland Kitchen docusate sodium (COLACE) 100 MG  capsule Take 100 mg by mouth 2 (two) times daily.    . Glucosamine HCl 1500 MG TABS Take 1 tablet by mouth daily.    Marland Kitchen loratadine (CLARITIN) 10 MG tablet Take 10 mg by mouth daily.    . naproxen sodium (ALEVE) 220 MG tablet Take 220 mg by mouth 2 (two) times daily with a meal.    . Omega-3 Fatty Acids (OMEGA 3 PO) Take 1 capsule by mouth daily.    Marland Kitchen OVER THE COUNTER MEDICATION Take 500 mg by mouth daily. VITAMIN C /  ROSEHIPS    . vitamin E 400 UNIT capsule Take 400 Units by mouth daily.     No current facility-administered medications on file prior to visit.    BP 129/88 mmHg  Pulse 76  Temp(Src) 98.6 F (37 C) (Oral)  Resp 18  Ht 5' 6.5" (1.689 m)  Wt 239 lb (108.41 kg)  BMI 38.00 kg/m2  SpO2 100%       Objective:   Physical Exam  Constitutional: She is oriented to person, place, and time. She appears well-developed and well-nourished. No distress.  HENT:  Head: Normocephalic and atraumatic.  Cardiovascular: Normal rate and regular rhythm.   No murmur heard. Pulmonary/Chest: Effort normal and breath sounds normal. No respiratory distress. She has no wheezes. She has no rales. She exhibits no tenderness.  Musculoskeletal: She exhibits no edema.  Neurological: She is alert and oriented to person, place, and time.  Skin: Skin is warm and dry.  Psychiatric: She has a normal mood and affect. Her behavior is normal. Judgment and thought content normal.          Assessment & Plan:

## 2014-07-07 NOTE — Telephone Encounter (Signed)
Sugar is elevated.  I would like her to complete A1C, dx hyperglycemia.

## 2014-07-08 NOTE — Telephone Encounter (Signed)
Notified pt and she declines at this time. States she will wait until her follow up in February as she is wanting to work more on her diet and activity level.

## 2014-07-08 NOTE — Telephone Encounter (Signed)
Left message to return my call.  

## 2014-10-04 ENCOUNTER — Encounter: Payer: Self-pay | Admitting: Family

## 2014-10-04 ENCOUNTER — Ambulatory Visit (INDEPENDENT_AMBULATORY_CARE_PROVIDER_SITE_OTHER): Payer: 59 | Admitting: Family

## 2014-10-04 ENCOUNTER — Other Ambulatory Visit: Payer: Self-pay | Admitting: Family

## 2014-10-04 VITALS — BP 124/86 | HR 78 | Temp 98.0°F | Resp 16 | Ht 66.5 in | Wt 230.4 lb

## 2014-10-04 DIAGNOSIS — Z1231 Encounter for screening mammogram for malignant neoplasm of breast: Secondary | ICD-10-CM

## 2014-10-04 DIAGNOSIS — E785 Hyperlipidemia, unspecified: Secondary | ICD-10-CM

## 2014-10-04 DIAGNOSIS — Z0000 Encounter for general adult medical examination without abnormal findings: Secondary | ICD-10-CM

## 2014-10-04 DIAGNOSIS — R7301 Impaired fasting glucose: Secondary | ICD-10-CM

## 2014-10-04 DIAGNOSIS — R739 Hyperglycemia, unspecified: Secondary | ICD-10-CM

## 2014-10-04 DIAGNOSIS — I10 Essential (primary) hypertension: Secondary | ICD-10-CM

## 2014-10-04 DIAGNOSIS — K219 Gastro-esophageal reflux disease without esophagitis: Secondary | ICD-10-CM

## 2014-10-04 LAB — BASIC METABOLIC PANEL
BUN: 11 mg/dL (ref 6–23)
CALCIUM: 9.6 mg/dL (ref 8.4–10.5)
CHLORIDE: 100 meq/L (ref 96–112)
CO2: 33 meq/L — AB (ref 19–32)
Creatinine, Ser: 0.69 mg/dL (ref 0.40–1.20)
GFR: 110.22 mL/min (ref >60.00)
GLUCOSE: 121 mg/dL — AB (ref 70–99)
Potassium: 3.5 mEq/L (ref 3.5–5.1)
Sodium: 141 mEq/L (ref 135–145)

## 2014-10-04 LAB — LIPID PANEL
CHOL/HDL RATIO: 5
CHOLESTEROL: 152 mg/dL (ref 0–200)
HDL: 33 mg/dL — ABNORMAL LOW (ref >39.00)
LDL Cholesterol: 100 mg/dL — ABNORMAL HIGH (ref 0–99)
NonHDL: 119
Triglycerides: 96 mg/dL (ref 0.0–149.0)
VLDL: 19.2 mg/dL (ref 0.0–40.0)

## 2014-10-04 LAB — HEMOGLOBIN A1C: Hgb A1c MFr Bld: 8.6 % — ABNORMAL HIGH (ref 4.6–6.5)

## 2014-10-04 NOTE — Progress Notes (Signed)
Pre visit review using our clinic review tool, if applicable. No additional management support is needed unless otherwise documented below in the visit note. 

## 2014-10-04 NOTE — Assessment & Plan Note (Signed)
Tolerating statin, obtain flp 

## 2014-10-04 NOTE — Assessment & Plan Note (Signed)
Obtain a1c, discussed diet/exercise.

## 2014-10-04 NOTE — Progress Notes (Signed)
Subjective:    Patient ID: Elizabeth Vang, female    DOB: 05-28-1951, 64 y.o.   MRN: [id number]  HPI Ms/ Lowy is here today for follow up for multiple medical conditions.  1. HYPERTENSION: Reports compliance with HCTZ. Denies chest pain, dyspnea, or lower extremity edema. BP Readings from Last 3 Encounters:  10/04/14 124/86  07/07/14 129/88  06/17/14 146/96   2. GERD: Taking omeprazole for heartburn and it works very well. She can tell when she misses a dose because heartburn returns.  3. Hyperlipidemia: Reports compliance with atorvastatin. Denies myalgia.  Lab Results  Component Value Date   CHOL 165 08/03/2013   HDL 40.00 08/03/2013   LDLCALC 106* 08/03/2013   TRIG 95.0 08/03/2013   CHOLHDL 4 08/03/2013   4. Hyperglycemia: Glucose 180 2 months ago. Pt declined A1c and wanted to work on diet and exercise. Diet: Mostly eats healthy. Avoiding sweets. Exercise: not able to exercise as much as she should. She is caring for her mother who lives with her. Walking 1-2x per week for 20-25 minutes.  Review of Systems  Respiratory: Negative for shortness of breath.   Cardiovascular: Negative for chest pain and leg swelling.   Past Medical History  Diagnosis Date  . Cancer 12/12/2000    breast  . Hyperlipidemia     History   Social History  . Marital Status: Married    Spouse Name: N/A    Number of Children: N/A  . Years of Education: N/A   Occupational History  . Not on file.   Social History Main Topics  . Smoking status: Never Smoker   . Smokeless tobacco: Never Used  . Alcohol Use: Yes     Comment: rarely drinks wine  . Drug Use: Not on file  . Sexual Activity: Not on file   Other Topics Concern  . 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 Ganglion cyst excision Right 1970    wrist  . Appendectomy  1982  . Laparoscopic salpingo oopherectomy Left 1984    benign tumor  . Breast surgery Right 12/12/00    lumpectomy / axillary node dissection, sentinel node bx (cancer)    Family History  Problem Relation Age of Onset  . Hypertension Mother   . Stroke Mother   . Cancer Mother 40    uterine  . Heart disease Mother     CHF  . Diabetes Father   . Lymphoma Father     Allergies  Allergen Reactions  . Oxycodone-Acetaminophen     hallucinations    Current Outpatient Prescriptions on File Prior to Visit  Medication Sig Dispense Refill  . atorvastatin (LIPITOR) 20 MG tablet TAKE 1 TABLET BY MOUTH ONCE DAILY 90 tablet 1  . CALCIUM-MAGNESIUM-VITAMIN D PO Take 1 tablet by mouth daily.    Marland Kitchen docusate sodium (COLACE) 100 MG capsule Take 100 mg by mouth 2 (two) times daily.    . Glucosamine HCl 1500 MG TABS Take 1 tablet by mouth daily.    . hydrochlorothiazide (HYDRODIURIL) 25 MG tablet Take 1 tablet (25 mg total) by mouth daily. 90 tablet 1  . loratadine (CLARITIN) 10 MG tablet Take 10 mg by mouth daily.    Marland Kitchen  naproxen sodium (ALEVE) 220 MG tablet Take 220 mg by mouth 2 (two) times daily with a meal.    . Omega-3 Fatty Acids (OMEGA 3 PO) Take 1 capsule by mouth daily.    Marland Kitchen omeprazole (PRILOSEC) 40 MG capsule Take 1 capsule (40 mg total) by mouth daily. 90 capsule 1  . OVER THE COUNTER MEDICATION Take 500 mg by mouth daily. VITAMIN C /  ROSEHIPS    . vitamin E 400 UNIT capsule Take 400 Units by mouth daily.     No current facility-administered medications on file prior to visit.    BP 124/86 mmHg  Pulse 78  Temp(Src) 98 F (36.7 C) (Oral)  Resp 16  Ht 5' 6.5" (1.689 m)  Wt 230 lb 6 oz (104.497 kg)  BMI 36.63 kg/m2  SpO2 95%       Objective:   Physical Exam  Constitutional: She appears well-developed and well-nourished.  Cardiovascular: Normal rate, regular rhythm and normal heart sounds.   No  murmur heard. Pulmonary/Chest: Effort normal and breath sounds normal. No respiratory distress. She has no wheezes.  Musculoskeletal: She exhibits no edema.  Psychiatric: She has a normal mood and affect. Her behavior is normal. Judgment and thought content normal.          Assessment & Plan:

## 2014-10-04 NOTE — Patient Instructions (Signed)
Please complete lab work prior to leaving. Schedule physical in 3 months.

## 2014-10-04 NOTE — Assessment & Plan Note (Signed)
BP stable on hctz, continue same, obtain bmet

## 2014-10-04 NOTE — Assessment & Plan Note (Signed)
Stable on PPI 

## 2014-10-06 ENCOUNTER — Telehealth: Payer: Self-pay | Admitting: Family

## 2014-10-06 MED ORDER — METFORMIN HCL 500 MG PO TABS: 500.0000 mg | ORAL_TABLET | Freq: Two times a day (BID) | ORAL | Status: DC

## 2014-10-06 NOTE — Telephone Encounter (Signed)
Lab results discussed with patient.  She stated understanding and agreed to comply with plan.  Pt has a CPE appointment scheduled for 01/02/15 @ 9:00 am.

## 2014-10-06 NOTE — Telephone Encounter (Signed)
Lab work showing uncontrolled diabetes. A1C is 8.6, goal is <7.  I would like her to start metformin 500mg  bid, work on diabetic diet, exercise, weight loss.  Follow up in 3 months.

## 2014-10-11 ENCOUNTER — Ambulatory Visit: Payer: 59 | Admitting: Family

## 2014-12-13 ENCOUNTER — Telehealth: Payer: Self-pay | Admitting: Family

## 2014-12-13 NOTE — Telephone Encounter (Signed)
Pre Visit letter sent  °

## 2014-12-26 ENCOUNTER — Telehealth: Payer: Self-pay | Admitting: Family

## 2014-12-26 NOTE — Telephone Encounter (Signed)
Spoke with pt and advised her that she would need evaluation in the office first before mammogram could be changed to diagnostic. Pt states she already has appt on 01/02/15 and does not want to come in earlier.  Please advise if further instruction.

## 2014-12-26 NOTE — Telephone Encounter (Signed)
Will evaluate on 5/9.

## 2014-12-26 NOTE — Telephone Encounter (Signed)
Caller name: Tishia Relation to pt: self Call back number: [phone] or [phone] Pharmacy:  Reason for call:   Patient states that she is requesting to have a Diagnostic mammogram order to be sent to the [facility] on [address] Patient states that she is having a little more firmness and nodule on right breast near sternum area.

## 2014-12-30 ENCOUNTER — Telehealth: Payer: Self-pay | Admitting: *Deleted

## 2014-12-30 NOTE — Telephone Encounter (Signed)
Patient answered call but declined pre-visit call.

## 2015-01-02 ENCOUNTER — Ambulatory Visit (INDEPENDENT_AMBULATORY_CARE_PROVIDER_SITE_OTHER): Payer: 59 | Admitting: Family

## 2015-01-02 ENCOUNTER — Encounter: Payer: Self-pay | Admitting: Family

## 2015-01-02 VITALS — BP 112/70 | HR 77 | Temp 98.2°F | Resp 16 | Ht 65.0 in | Wt 220.2 lb

## 2015-01-02 DIAGNOSIS — N63 Unspecified lump in unspecified breast: Secondary | ICD-10-CM

## 2015-01-02 DIAGNOSIS — IMO0002 Reserved for concepts with insufficient information to code with codable children: Secondary | ICD-10-CM

## 2015-01-02 DIAGNOSIS — Z0000 Encounter for general adult medical examination without abnormal findings: Secondary | ICD-10-CM

## 2015-01-02 DIAGNOSIS — E1165 Type 2 diabetes mellitus with hyperglycemia: Secondary | ICD-10-CM

## 2015-01-02 LAB — HEPATIC FUNCTION PANEL
ALBUMIN: 4.1 g/dL (ref 3.5–5.2)
ALT: 30 U/L (ref 0–35)
AST: 30 U/L (ref 0–37)
Alkaline Phosphatase: 56 U/L (ref 39–117)
BILIRUBIN TOTAL: 0.5 mg/dL (ref 0.2–1.2)
Bilirubin, Direct: 0.1 mg/dL (ref 0.0–0.3)
TOTAL PROTEIN: 7.8 g/dL (ref 6.0–8.3)

## 2015-01-02 LAB — CBC WITH DIFFERENTIAL/PLATELET
Basophils Absolute: 0 10*3/uL (ref 0.0–0.1)
Basophils Relative: 0.6 % (ref 0.0–3.0)
EOS ABS: 0.3 10*3/uL (ref 0.0–0.7)
Eosinophils Relative: 3.5 % (ref 0.0–5.0)
HEMATOCRIT: 41.4 % (ref 36.0–46.0)
HEMOGLOBIN: 13.3 g/dL (ref 12.0–15.0)
Lymphocytes Relative: 38.3 % (ref 12.0–46.0)
Lymphs Abs: 3 10*3/uL (ref 0.7–4.0)
MCHC: 32.1 g/dL (ref 30.0–36.0)
MCV: 74.3 fl — ABNORMAL LOW (ref 78.0–100.0)
MONOS PCT: 7 % (ref 3.0–12.0)
Monocytes Absolute: 0.5 10*3/uL (ref 0.1–1.0)
Neutro Abs: 3.9 10*3/uL (ref 1.4–7.7)
Neutrophils Relative %: 50.6 % (ref 43.0–77.0)
Platelets: 215 10*3/uL (ref 150.0–400.0)
RBC: 5.57 Mil/uL — ABNORMAL HIGH (ref 3.87–5.11)
RDW: 15 % (ref 11.5–15.5)
WBC: 7.8 10*3/uL (ref 4.0–10.5)

## 2015-01-02 LAB — URINALYSIS, ROUTINE W REFLEX MICROSCOPIC
Bilirubin Urine: NEGATIVE
Hgb urine dipstick: NEGATIVE
Ketones, ur: NEGATIVE
Leukocytes, UA: NEGATIVE
Nitrite: NEGATIVE
RBC / HPF: NONE SEEN (ref 0–?)
SPECIFIC GRAVITY, URINE: 1.01 (ref 1.000–1.030)
TOTAL PROTEIN, URINE-UPE24: NEGATIVE
URINE GLUCOSE: NEGATIVE
Urobilinogen, UA: 0.2 (ref 0.0–1.0)
WBC UA: NONE SEEN (ref 0–?)
pH: 7 (ref 5.0–8.0)

## 2015-01-02 LAB — BASIC METABOLIC PANEL
BUN: 17 mg/dL (ref 6–23)
CO2: 33 meq/L — AB (ref 19–32)
Calcium: 9.7 mg/dL (ref 8.4–10.5)
Chloride: 100 mEq/L (ref 96–112)
Creatinine, Ser: 0.69 mg/dL (ref 0.40–1.20)
GFR: 110.14 mL/min (ref >60.00)
GLUCOSE: 117 mg/dL — AB (ref 70–99)
POTASSIUM: 3.8 meq/L (ref 3.5–5.1)
SODIUM: 139 meq/L (ref 135–145)

## 2015-01-02 LAB — MICROALBUMIN / CREATININE URINE RATIO
CREATININE, U: 104.8 mg/dL
MICROALB/CREAT RATIO: 0.7 mg/g (ref 0.0–30.0)

## 2015-01-02 LAB — HEMOGLOBIN A1C: Hgb A1c MFr Bld: 7.3 % — ABNORMAL HIGH (ref 4.6–6.5)

## 2015-01-02 MED ORDER — HYDROCHLOROTHIAZIDE 25 MG PO TABS: 25.0000 mg | ORAL_TABLET | Freq: Every day | ORAL | Status: DC

## 2015-01-02 MED ORDER — ASPIRIN EC 81 MG PO TBEC: 81.0000 mg | DELAYED_RELEASE_TABLET | Freq: Every day | ORAL | Status: AC

## 2015-01-02 MED ORDER — METFORMIN HCL 500 MG PO TABS: 500.0000 mg | ORAL_TABLET | Freq: Two times a day (BID) | ORAL | Status: DC

## 2015-01-02 MED ORDER — OMEPRAZOLE 40 MG PO CPDR: 40.0000 mg | DELAYED_RELEASE_CAPSULE | Freq: Every day | ORAL | Status: DC

## 2015-01-02 MED ORDER — ATORVASTATIN CALCIUM 20 MG PO TABS: ORAL_TABLET | ORAL | Status: DC

## 2015-01-02 NOTE — Progress Notes (Signed)
Pre visit review using our clinic review tool, if applicable. No additional management support is needed unless otherwise documented below in the visit note. 

## 2015-01-02 NOTE — Progress Notes (Signed)
Subjective:    Patient ID: Elizabeth Vang, female    DOB: 09/21/1950, 64 y.o.   MRN: [id number]  HPI  Elizabeth Vang is a 64 yr old female who presents today for routine follow up.   Patient presents today for complete physical.  Immunizations:Tetanus up to date- believes she had zostavax and prevnar  (was given at work), Diet:  Reports diet is healthy Exercise: not exercising regularly Colonoscopy: 2012 was told 10 year follow up.  Dexa: 2012, has been on zometa in the past.  Declines dexa Pap Smear: 2014- normal  Mammogram: notes nodule right breast.     Review of Systems  Constitutional: Negative for unexpected weight change.  HENT: Negative for hearing loss and rhinorrhea.   Eyes: Negative for visual disturbance.  Respiratory: Negative for cough.   Cardiovascular: Negative for leg swelling.  Gastrointestinal: Positive for constipation. Negative for nausea and diarrhea.       Tries to control constipation with diet and stool softners.  1 bm every 3rd day  Notes hx of dysphagia, improves with PPI.  She has never had endoscopy. Declines endoscopy at this time.    Genitourinary: Negative for dysuria and frequency.  Musculoskeletal: Positive for arthralgias. Negative for myalgias.  Skin: Negative for rash.  Neurological: Negative for headaches.  Hematological: Negative for adenopathy.  Psychiatric/Behavioral: Negative for dysphoric mood and agitation.       Past Medical History  Diagnosis Date  . Cancer 12/12/2000    breast  . Hyperlipidemia     History   Social History  . Marital Status: Married    Spouse Name: N/A  . Number of Children: N/A  . Years of Education: N/A   Occupational History  . Not on file.   Social History Main Topics  . Smoking status: Never Smoker   . Smokeless tobacco: Never Used  . Alcohol Use: Yes     Comment: rarely drinks wine  . Drug Use: Not on file  . Sexual Activity: Not on file   Other Topics Concern  . 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 Ganglion cyst excision Right 1970    wrist  . Appendectomy  1982  . Laparoscopic salpingo oopherectomy Left 1984    benign tumor  . Breast surgery Right 12/12/00    lumpectomy / axillary node dissection, sentinel node bx (cancer)    Family History  Problem Relation Age of Onset  . Hypertension Mother   . Stroke Mother   . Cancer Mother 11    uterine  . Heart disease Mother     CHF  . Diabetes Father   . Lymphoma Father     Allergies  Allergen Reactions  . Oxycodone-Acetaminophen     hallucinations    Current Outpatient Prescriptions on File Prior to Visit  Medication Sig Dispense Refill  . atorvastatin (LIPITOR) 20 MG tablet TAKE 1 TABLET BY MOUTH ONCE DAILY 90 tablet 1  . CALCIUM-MAGNESIUM-VITAMIN D PO Take 1 tablet by mouth daily.    Marland Kitchen docusate sodium (COLACE) 100 MG capsule Take 100 mg by mouth 2 (two) times daily.    . Glucosamine HCl 1500 MG TABS Take 1 tablet by mouth daily.    Marland Kitchen  hydrochlorothiazide (HYDRODIURIL) 25 MG tablet Take 1 tablet (25 mg total) by mouth daily. 90 tablet 1  . loratadine (CLARITIN) 10 MG tablet Take 10 mg by mouth daily.    . metFORMIN (GLUCOPHAGE) 500 MG tablet Take 1 tablet (500 mg total) by mouth 2 (two) times daily with a meal. 60 tablet 3  . naproxen sodium (ALEVE) 220 MG tablet Take 220 mg by mouth 2 (two) times daily with a meal.    . Omega-3 Fatty Acids (OMEGA 3 PO) Take 1 capsule by mouth daily.    Marland Kitchen omeprazole (PRILOSEC) 40 MG capsule Take 1 capsule (40 mg total) by mouth daily. 90 capsule 1  . OVER THE COUNTER MEDICATION Take 500 mg by mouth daily. VITAMIN C /  ROSEHIPS    . vitamin E 400 UNIT capsule Take 400 Units by mouth daily.     No current facility-administered medications on  file prior to visit.    BP 112/70 mmHg  Pulse 77  Temp(Src) 98.2 F (36.8 C) (Oral)  Resp 16  Ht 5\' 5"  (1.651 m)  Wt 220 lb 3.2 oz (99.882 kg)  BMI 36.64 kg/m2  SpO2 97%    Objective:   Physical Exam  Physical Exam  Constitutional: She is oriented to person, place, and time. She appears well-developed and well-nourished. No distress.  HENT:  Head: Normocephalic and atraumatic.  Right Ear: Tympanic membrane and ear canal normal.  Left Ear: Tympanic membrane and ear canal normal.  Mouth/Throat: Oropharynx is clear and moist.  Eyes: Pupils are equal, round, and reactive to light. No scleral icterus.  Neck: Normal range of motion. No thyromegaly present.  Cardiovascular: Normal rate and regular rhythm.   No murmur heard. Pulmonary/Chest: Effort normal and breath sounds normal. No respiratory distress. He has no wheezes. She has no rales. She exhibits no tenderness.  Abdominal: Soft. Bowel sounds are normal. He exhibits no distension and no mass. There is no tenderness. There is no rebound and no guarding.  Musculoskeletal: She exhibits no edema.  Lymphadenopathy:    She has no cervical adenopathy.  Neurological: She is alert and oriented to person, place, and time. Unable to elicit patellar reflexes. She exhibits normal muscle tone. Coordination normal.  Skin: Skin is warm and dry.  Psychiatric: She has a normal mood and affect. Her behavior is normal. Judgment and thought content normal.  Breasts: Examined lying Right: + thickening along scar, + mass noted subareolar Left: Without masses, retractions, discharge or axillary adenopathy.  Pelvic: deferred.         Assessment & Plan:         Assessment & Plan:

## 2015-01-02 NOTE — Patient Instructions (Addendum)
Please bring [place] a copy of your immunization records from work. Please complete lab work prior to leaving. Continue your work on [employer], exercise, weight loss.   You will be contacted about your breast imaging- call us if you have not heard back in 1 week.  Follow up in 3 months.

## 2015-01-03 ENCOUNTER — Encounter: Payer: Self-pay | Admitting: Family

## 2015-01-03 NOTE — Assessment & Plan Note (Signed)
Advised pt to bring [place] her immunization records from work so we can verify immunization status. Discussed healthy diet, exercise weight loss.  Plan pap next year.  , colo up to date, dexa- declines

## 2015-01-03 NOTE — Assessment & Plan Note (Signed)
New refer for diagnostic mammo/US for further evaluation.

## 2015-01-13 ENCOUNTER — Ambulatory Visit
Admission: RE | Admit: 2015-01-13 | Discharge: 2015-01-13 | Disposition: A | Discharge status: Home / Self Care | Payer: 59 | Source: Ambulatory Visit | Attending: Family | Admitting: Family

## 2015-01-13 DIAGNOSIS — N63 Unspecified lump in unspecified breast: Secondary | ICD-10-CM

## 2015-02-09 ENCOUNTER — Encounter: Payer: Self-pay | Admitting: *Deleted

## 2015-02-20 ENCOUNTER — Other Ambulatory Visit: Payer: Self-pay

## 2015-03-10 ENCOUNTER — Other Ambulatory Visit: Payer: Self-pay | Admitting: Family

## 2015-03-10 NOTE — Telephone Encounter (Signed)
Last filled:  01/02/15 Amt: 90, 1  Too soon to refill.

## 2015-04-04 ENCOUNTER — Encounter: Payer: Self-pay | Admitting: Family

## 2015-04-04 ENCOUNTER — Ambulatory Visit (INDEPENDENT_AMBULATORY_CARE_PROVIDER_SITE_OTHER): Payer: 59 | Admitting: Family

## 2015-04-04 VITALS — BP 120/78 | HR 68 | Temp 97.7°F | Resp 16 | Ht 65.0 in | Wt 219.4 lb

## 2015-04-04 DIAGNOSIS — E1165 Type 2 diabetes mellitus with hyperglycemia: Secondary | ICD-10-CM

## 2015-04-04 DIAGNOSIS — I10 Essential (primary) hypertension: Secondary | ICD-10-CM | POA: Diagnosis not present

## 2015-04-04 DIAGNOSIS — E785 Hyperlipidemia, unspecified: Secondary | ICD-10-CM | POA: Diagnosis not present

## 2015-04-04 DIAGNOSIS — IMO0002 Reserved for concepts with insufficient information to code with codable children: Secondary | ICD-10-CM

## 2015-04-04 LAB — LIPID PANEL
CHOL/HDL RATIO: 4
Cholesterol: 157 mg/dL (ref 0–200)
HDL: 35.3 mg/dL — AB (ref >39.00)
LDL Cholesterol: 102 mg/dL — ABNORMAL HIGH (ref 0–99)
NONHDL: 121.31
TRIGLYCERIDES: 97 mg/dL (ref 0.0–149.0)
VLDL: 19.4 mg/dL (ref 0.0–40.0)

## 2015-04-04 LAB — BASIC METABOLIC PANEL
BUN: 12 mg/dL (ref 6–23)
CALCIUM: 9.8 mg/dL (ref 8.4–10.5)
CO2: 32 meq/L (ref 19–32)
Chloride: 99 mEq/L (ref 96–112)
Creatinine, Ser: 0.7 mg/dL (ref 0.40–1.20)
GFR: 108.24 mL/min (ref >60.00)
GLUCOSE: 96 mg/dL (ref 70–99)
POTASSIUM: 3.8 meq/L (ref 3.5–5.1)
Sodium: 139 mEq/L (ref 135–145)

## 2015-04-04 LAB — HEMOGLOBIN A1C: Hgb A1c MFr Bld: 6.8 % — ABNORMAL HIGH (ref 4.6–6.5)

## 2015-04-04 NOTE — Progress Notes (Signed)
Pre visit review using our clinic review tool, if applicable. No additional management support is needed unless otherwise documented below in the visit note. 

## 2015-04-04 NOTE — Patient Instructions (Addendum)
Please schedule eye exam at your earliest convenience.   Please schedule a follow up appointment in 3 months.

## 2015-04-04 NOTE — Progress Notes (Signed)
Subjective:    Patient ID: Elizabeth Vang, female    DOB: 24-May-1951, 64 y.o.   MRN: [id number]  HPI  Patient presents today for follow up of multiple medical problems.  Diabetes Type 2  Pt is currently maintained on the following medications for diabetes:metformin- declines ACE.  Lab Results  Component Value Date   HGBA1C 7.3* 01/02/2015   HGBA1C 8.6* 10/04/2014    Lab Results  Component Value Date   MICROALBUR <0.7 01/02/2015   LDLCALC 100* 10/04/2014   CREATININE 0.69 01/02/2015    Last diabetic eye exam was 3 yrs ago Denies polyuria/polydipsia. Denies hypoglycemia Home glucose readings range:  Not checking sugars- does not wish to start   Hyperlipidemia  Patient is currently maintained on the following medication for hyperlipidemia: lipitor  Last lipid panel as follows:  Lab Results  Component Value Date   CHOL 152 10/04/2014   HDL 33.00* 10/04/2014   LDLCALC 100* 10/04/2014   TRIG 96.0 10/04/2014   CHOLHDL 5 10/04/2014   Patient denies myalgia. Patient reports good compliance with low fat/low cholesterol diet.   Hypertension  Patient is currently maintained on the following medications for blood pressure: hctz Patient reports good compliance with blood pressure medications. Patient denies chest pain, shortness of breath or swelling. Last 3 blood pressure readings in our office are as follows: BP Readings from Last 3 Encounters:  04/04/15 120/78  01/02/15 112/70  10/04/14 124/86    Review of Systems    see HPI  Past Medical History  Diagnosis Date  . Cancer 12/12/2000    breast  . Hyperlipidemia     History   Social History  . Marital Status: Married    Spouse Name: N/A  . Number of Children: N/A  . Years of Education: N/A   Occupational History  . Not on file.   Social History Main Topics  . Smoking status: Never Smoker   . Smokeless tobacco: Never Used  . Alcohol Use: Yes     Comment: rarely drinks wine  . Drug Use: Not  on file  . Sexual Activity: Not on file   Other Topics Concern  . 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 Ganglion cyst excision Right 1970    wrist  . Appendectomy  1982  . Laparoscopic salpingo oopherectomy Left 1984    benign tumor  . Breast surgery Right 12/12/00    lumpectomy / axillary node dissection, sentinel node bx (cancer)    Family History  Problem Relation Age of Onset  . Hypertension Mother   . Stroke Mother   . Cancer Mother 44    uterine  . Heart disease Mother     CHF  . Diabetes Father   . Lymphoma Father   . Lung disease Mother     interstital lung fibrosis  . Hypertension Brother   . Diabetes type II Brother   . Hypertension Sister     2 sisters  . Diabetes Mellitus II Sister     2 sisters    Allergies  Allergen Reactions  . Oxycodone-Acetaminophen     hallucinations    Current Outpatient Prescriptions on File Prior to Visit  Medication Sig Dispense Refill  .  aspirin EC 81 MG tablet Take 1 tablet (81 mg total) by mouth daily.    Marland Kitchen atorvastatin (LIPITOR) 20 MG tablet TAKE 1 TABLET BY MOUTH ONCE DAILY 90 tablet 1  . CALCIUM-MAGNESIUM-VITAMIN D PO Take 1 tablet by mouth daily.    Marland Kitchen docusate sodium (COLACE) 100 MG capsule Take 100 mg by mouth 2 (two) times daily.    . Glucosamine HCl 1500 MG TABS Take 1 tablet by mouth daily.    . hydrochlorothiazide (HYDRODIURIL) 25 MG tablet Take 1 tablet (25 mg total) by mouth daily. 90 tablet 1  . loratadine (CLARITIN) 10 MG tablet Take 10 mg by mouth daily.    . metFORMIN (GLUCOPHAGE) 500 MG tablet Take 1 tablet (500 mg total) by mouth 2 (two) times daily with a meal. 180 tablet 1  . naproxen sodium (ALEVE) 220 MG tablet Take 220 mg by mouth 2 (two) times daily with a meal.    . Omega-3  Fatty Acids (OMEGA 3 PO) Take 1 capsule by mouth daily.    Marland Kitchen omeprazole (PRILOSEC) 40 MG capsule Take 1 capsule (40 mg total) by mouth daily. 90 capsule 1  . OVER THE COUNTER MEDICATION Take 500 mg by mouth daily. VITAMIN C /  ROSEHIPS    . vitamin E 400 UNIT capsule Take 400 Units by mouth daily.     No current facility-administered medications on file prior to visit.    BP 120/78 mmHg  Pulse 68  Temp(Src) 97.7 F (36.5 C) (Oral)  Resp 16  Ht 5\' 5"  (1.651 m)  Wt 219 lb 6.4 oz (99.519 kg)  BMI 36.51 kg/m2  SpO2 98%    Objective:   Physical Exam  Constitutional: She is oriented to person, place, and time. She appears well-developed and well-nourished.  Cardiovascular: Normal rate, regular rhythm and normal heart sounds.   No murmur heard. Pulmonary/Chest: Effort normal and breath sounds normal. No respiratory distress. She has no wheezes.  Musculoskeletal: She exhibits no edema.  Neurological: She is alert and oriented to person, place, and time.  Psychiatric: She has a normal mood and affect. Her behavior is normal. Judgment and thought content normal.          Assessment & Plan:

## 2015-04-04 NOTE — Assessment & Plan Note (Signed)
BP stable, continue hctz, obtain bmet.

## 2015-04-04 NOTE — Assessment & Plan Note (Signed)
Repeat flp.  Cont statin

## 2015-04-06 ENCOUNTER — Telehealth: Payer: Self-pay | Admitting: Family

## 2015-04-06 NOTE — Telephone Encounter (Signed)
Sugar has improved.  LDL still slightly above goal.  I would advise her to increase her lipitor 20mg  to 1.5 tabs PO daily.  Could you please send a refill with this quant? Thanks.

## 2015-04-07 MED ORDER — HYDROCHLOROTHIAZIDE 25 MG PO TABS: 25.0000 mg | ORAL_TABLET | Freq: Every day | ORAL | Status: DC

## 2015-04-07 MED ORDER — ATORVASTATIN CALCIUM 20 MG PO TABS: ORAL_TABLET | ORAL | Status: DC

## 2015-04-07 NOTE — Telephone Encounter (Signed)
Patient informed of results/PCP instructions.  Updated medication list and sent in prescription to [facility] long as pt. Requested.

## 2015-04-07 NOTE — Telephone Encounter (Signed)
Called the patient on both cell/home number left message to call back

## 2015-04-07 NOTE — Telephone Encounter (Signed)
Pt returned call, ph # [phone]

## 2015-04-07 NOTE — Telephone Encounter (Signed)
Pt returning your call, states she is at work and her phone is on vibrate, states when you call her back if she does not answer it is ok to leave a message on her voice mail

## 2015-05-22 LAB — QUANTIFERON TB GOLD ASSAY (BLOOD): QUANTIFERON TB GOLD: NEGATIVE

## 2015-07-04 ENCOUNTER — Ambulatory Visit: Payer: 59 | Admitting: Family

## 2015-07-14 ENCOUNTER — Ambulatory Visit (INDEPENDENT_AMBULATORY_CARE_PROVIDER_SITE_OTHER): Payer: 59 | Admitting: Family

## 2015-07-14 ENCOUNTER — Encounter: Payer: Self-pay | Admitting: Family

## 2015-07-14 VITALS — BP 114/72 | HR 94 | Temp 98.6°F | Resp 16 | Ht 65.0 in | Wt 220.8 lb

## 2015-07-14 DIAGNOSIS — E1310 Other specified diabetes mellitus with ketoacidosis without coma: Secondary | ICD-10-CM | POA: Diagnosis not present

## 2015-07-14 DIAGNOSIS — E1165 Type 2 diabetes mellitus with hyperglycemia: Secondary | ICD-10-CM

## 2015-07-14 DIAGNOSIS — I10 Essential (primary) hypertension: Secondary | ICD-10-CM

## 2015-07-14 DIAGNOSIS — E785 Hyperlipidemia, unspecified: Secondary | ICD-10-CM

## 2015-07-14 DIAGNOSIS — E1140 Type 2 diabetes mellitus with diabetic neuropathy, unspecified: Secondary | ICD-10-CM

## 2015-07-14 DIAGNOSIS — E1110 Type 2 diabetes mellitus with ketoacidosis without coma: Secondary | ICD-10-CM

## 2015-07-14 DIAGNOSIS — IMO0002 Reserved for concepts with insufficient information to code with codable children: Secondary | ICD-10-CM

## 2015-07-14 LAB — BASIC METABOLIC PANEL
BUN: 13 mg/dL (ref 6–23)
CHLORIDE: 99 meq/L (ref 96–112)
CO2: 33 mEq/L — ABNORMAL HIGH (ref 19–32)
CREATININE: 0.69 mg/dL (ref 0.40–1.20)
Calcium: 9.8 mg/dL (ref 8.4–10.5)
GFR: 109.95 mL/min (ref >60.00)
GLUCOSE: 108 mg/dL — AB (ref 70–99)
POTASSIUM: 3.7 meq/L (ref 3.5–5.1)
Sodium: 141 mEq/L (ref 135–145)

## 2015-07-14 LAB — LIPID PANEL
CHOL/HDL RATIO: 5
Cholesterol: 152 mg/dL (ref 0–200)
HDL: 31.1 mg/dL — AB (ref >39.00)
LDL Cholesterol: 98 mg/dL (ref 0–99)
NONHDL: 120.71
Triglycerides: 112 mg/dL (ref 0.0–149.0)
VLDL: 22.4 mg/dL (ref 0.0–40.0)

## 2015-07-14 LAB — HEMOGLOBIN A1C: Hgb A1c MFr Bld: 7.1 % — ABNORMAL HIGH (ref 4.6–6.5)

## 2015-07-14 MED ORDER — OMEPRAZOLE 40 MG PO CPDR: 40.0000 mg | DELAYED_RELEASE_CAPSULE | Freq: Every day | ORAL | Status: DC

## 2015-07-14 MED ORDER — ATORVASTATIN CALCIUM 20 MG PO TABS: ORAL_TABLET | ORAL | Status: DC

## 2015-07-14 MED ORDER — METFORMIN HCL 500 MG PO TABS: 500.0000 mg | ORAL_TABLET | Freq: Two times a day (BID) | ORAL | Status: DC

## 2015-07-14 NOTE — Patient Instructions (Signed)
Please complete lab work prior to leaving.   

## 2015-07-14 NOTE — Progress Notes (Signed)
Pre visit review using our clinic review tool, if applicable. No additional management support is needed unless otherwise documented below in the visit note. 

## 2015-07-14 NOTE — Assessment & Plan Note (Signed)
Stable, continue HCTZ

## 2015-07-14 NOTE — Assessment & Plan Note (Signed)
Clinically stable on metformin.  Continue same.

## 2015-07-14 NOTE — Assessment & Plan Note (Signed)
Tolerating statin, obtain follow up FLP 

## 2015-07-14 NOTE — Progress Notes (Signed)
Subjective:    Patient ID: Elizabeth Vang, female    DOB: 08-14-1951, 64 y.o.   MRN: [id number]  HPI  1) DM2- Pt is maintained on metformin.   Lab Results  Component Value Date   HGBA1C 6.8* 04/04/2015   HGBA1C 7.3* 01/02/2015   HGBA1C 8.6* 10/04/2014   Lab Results  Component Value Date   MICROALBUR <0.7 01/02/2015   LDLCALC 102* 04/04/2015   CREATININE 0.70 04/04/2015    2) HTN- on hctz. BP Readings from Last 3 Encounters:  07/14/15 114/72  04/04/15 120/78  01/02/15 112/70    3) Hyperlipidemia-  Tolerating statin without myalgia  Review of Systems See HPI  Past Medical History  Diagnosis Date  . Cancer 12/12/2000    breast  . Hyperlipidemia     Social History   Social History  . Marital Status: Married    Spouse Name: N/A  . Number of Children: N/A  . Years of Education: N/A   Occupational History  . Not on file.   Social History Main Topics  . Smoking status: Never Smoker   . Smokeless tobacco: Never Used  . Alcohol Use: Yes     Comment: rarely drinks wine  . Drug Use: Not on file  . Sexual Activity: Not on file   Other Topics Concern  . 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 Ganglion cyst excision Right 1970    wrist  . Appendectomy  1982  . Laparoscopic salpingo oopherectomy Left 1984    benign tumor  . Breast surgery Right 12/12/00    lumpectomy / axillary node dissection, sentinel node bx (cancer)    Family History  Problem Relation Age of Onset  . Hypertension Mother   . Stroke Mother   . Cancer Mother 51    uterine  . Heart disease Mother     CHF  . Diabetes Father   . Lymphoma Father   . Lung disease Mother     interstital lung fibrosis  . Hypertension Brother   . Diabetes type II Brother   .  Hypertension Sister     2 sisters  . Diabetes Mellitus II Sister     2 sisters    Allergies  Allergen Reactions  . Oxycodone-Acetaminophen     hallucinations    Current Outpatient Prescriptions on File Prior to Visit  Medication Sig Dispense Refill  . aspirin EC 81 MG tablet Take 1 tablet (81 mg total) by mouth daily.    Marland Kitchen atorvastatin (LIPITOR) 20 MG tablet Take 1.5 by mouth daily 45 tablet 1  . CALCIUM-MAGNESIUM-VITAMIN D PO Take 1 tablet by mouth daily.    Marland Kitchen docusate sodium (COLACE) 100 MG capsule Take 100 mg by mouth 2 (two) times daily.    . Glucosamine HCl 1500 MG TABS Take 1 tablet by mouth daily.    . hydrochlorothiazide (HYDRODIURIL) 25 MG tablet Take 1 tablet (25 mg total) by mouth daily. 90 tablet 1  . loratadine (CLARITIN) 10 MG tablet Take 10 mg by mouth daily.    . metFORMIN (GLUCOPHAGE) 500 MG tablet Take 1 tablet (500 mg total) by mouth 2 (two) times daily with a meal. 180 tablet 1  . naproxen  sodium (ALEVE) 220 MG tablet Take 220 mg by mouth 2 (two) times daily with a meal.    . Omega-3 Fatty Acids (OMEGA 3 PO) Take 1 capsule by mouth daily.    Marland Kitchen omeprazole (PRILOSEC) 40 MG capsule Take 1 capsule (40 mg total) by mouth daily. 90 capsule 1  . OVER THE COUNTER MEDICATION Take 500 mg by mouth daily. VITAMIN C /  ROSEHIPS    . vitamin E 400 UNIT capsule Take 400 Units by mouth daily.     No current facility-administered medications on file prior to visit.    BP 114/72 mmHg  Pulse 94  Temp(Src) 98.6 F (37 C) (Oral)  Resp 16  Ht 5\' 5"  (1.651 m)  Wt 220 lb 12.8 oz (100.154 kg)  BMI 36.74 kg/m2  SpO2 98%       Objective:   Physical Exam  Constitutional: She is oriented to person, place, and time. She appears well-developed and well-nourished.  HENT:  Head: Normocephalic and atraumatic.  Cardiovascular: Normal rate, regular rhythm and normal heart sounds.   No murmur heard. Pulmonary/Chest: Effort normal and breath sounds normal. No respiratory distress.  She has no wheezes.  Musculoskeletal: She exhibits no edema.  Neurological: She is alert and oriented to person, place, and time.  Psychiatric: She has a normal mood and affect. Her behavior is normal. Judgment and thought content normal.          Assessment & Plan:

## 2015-07-16 ENCOUNTER — Other Ambulatory Visit: Payer: Self-pay | Admitting: Family

## 2015-07-16 ENCOUNTER — Encounter: Payer: Self-pay | Admitting: Family

## 2015-07-16 MED ORDER — METFORMIN HCL 500 MG PO TABS: 1000.0000 mg | ORAL_TABLET | Freq: Two times a day (BID) | ORAL | Status: DC

## 2015-08-31 MED FILL — ATORVASTATIN 20 MG TABLET: 20 | 30 days supply | Qty: 45 | Fill #1

## 2015-09-28 ENCOUNTER — Other Ambulatory Visit: Payer: Self-pay | Admitting: Family

## 2015-09-28 MED FILL — HYDROCHLOROTHIAZIDE 25 MG T: 25 | 90 days supply | Qty: 90 | Fill #1

## 2015-09-29 MED FILL — metFORMIN HCL 500 MG TABS: 500 | 90 days supply | Qty: 360 | Fill #0

## 2015-10-04 ENCOUNTER — Other Ambulatory Visit: Payer: Self-pay | Admitting: Family

## 2015-10-16 ENCOUNTER — Ambulatory Visit (INDEPENDENT_AMBULATORY_CARE_PROVIDER_SITE_OTHER): Payer: 59 | Admitting: Family

## 2015-10-16 ENCOUNTER — Encounter: Payer: Self-pay | Admitting: Family

## 2015-10-16 VITALS — BP 109/75 | HR 78 | Temp 98.8°F | Resp 16 | Ht 65.0 in | Wt 216.8 lb

## 2015-10-16 DIAGNOSIS — E1140 Type 2 diabetes mellitus with diabetic neuropathy, unspecified: Secondary | ICD-10-CM | POA: Diagnosis not present

## 2015-10-16 DIAGNOSIS — K219 Gastro-esophageal reflux disease without esophagitis: Secondary | ICD-10-CM

## 2015-10-16 DIAGNOSIS — IMO0002 Reserved for concepts with insufficient information to code with codable children: Secondary | ICD-10-CM

## 2015-10-16 DIAGNOSIS — E1165 Type 2 diabetes mellitus with hyperglycemia: Secondary | ICD-10-CM

## 2015-10-16 DIAGNOSIS — E785 Hyperlipidemia, unspecified: Secondary | ICD-10-CM

## 2015-10-16 DIAGNOSIS — I10 Essential (primary) hypertension: Secondary | ICD-10-CM

## 2015-10-16 DIAGNOSIS — E119 Type 2 diabetes mellitus without complications: Secondary | ICD-10-CM

## 2015-10-16 LAB — MICROALBUMIN / CREATININE URINE RATIO
CREATININE, U: 38.6 mg/dL
Microalb Creat Ratio: 1.8 mg/g (ref 0.0–30.0)

## 2015-10-16 LAB — HEMOGLOBIN A1C: HEMOGLOBIN A1C: 6.8 % — AB (ref 4.6–6.5)

## 2015-10-16 LAB — BASIC METABOLIC PANEL
BUN: 15 mg/dL (ref 6–23)
CHLORIDE: 100 meq/L (ref 96–112)
CO2: 32 mEq/L (ref 19–32)
Calcium: 9.6 mg/dL (ref 8.4–10.5)
Creatinine, Ser: 0.67 mg/dL (ref 0.40–1.20)
GFR: 113.66 mL/min (ref >60.00)
Glucose, Bld: 112 mg/dL — ABNORMAL HIGH (ref 70–99)
POTASSIUM: 3.9 meq/L (ref 3.5–5.1)
SODIUM: 140 meq/L (ref 135–145)

## 2015-10-16 MED ORDER — ATORVASTATIN CALCIUM 20 MG PO TABS: ORAL_TABLET | ORAL | Status: DC

## 2015-10-16 MED FILL — ATORVASTATIN 20 MG TABLET: 20 | 90 days supply | Qty: 135 | Fill #0

## 2015-10-16 NOTE — Progress Notes (Signed)
Pre visit review using our clinic review tool, if applicable. No additional management support is needed unless otherwise documented below in the visit note. 

## 2015-10-16 NOTE — Progress Notes (Signed)
Subjective:    Patient ID: Edward Qualia, female    DOB: [birth year], 65 y.o.   MRN: [id number]  HPI  Ms.  Mcdermitt is a 65 yr old female who presents today for follow up.  DM2- maintained on metformin.   Eye exam is due.  She will arrange appointment. Not checking her sugars. Denies polyuria/polydipsia Lab Results  Component Value Date   HGBA1C 7.1* 07/14/2015   HGBA1C 6.8* 04/04/2015   HGBA1C 7.3* 01/02/2015   Lab Results  Component Value Date   MICROALBUR <0.7 01/02/2015   LDLCALC 98 07/14/2015   CREATININE 0.69 07/14/2015   HTN- maintained on hctz.    BP Readings from Last 3 Encounters:  10/16/15 109/75  07/14/15 114/72  04/04/15 120/78   Hyperlipidemia- on lipitor- has been out x 2 weeks.  Lab Results  Component Value Date   CHOL 152 07/14/2015   HDL 31.10* 07/14/2015   LDLCALC 98 07/14/2015   TRIG 112.0 07/14/2015   CHOLHDL 5 07/14/2015   GERD- stable on prilosec.   Review of Systems    see HPI  Past Medical History  Diagnosis Date  . Cancer ([place]) 12/12/2000    breast  . Hyperlipidemia     Social History   Social History  . Marital Status: Married    Spouse Name: N/A  . Number of Children: N/A  . Years of Education: N/A   Occupational History  . Not on file.   Social History Main Topics  . Smoking status: Never Smoker   . Smokeless tobacco: Never Used  . Alcohol Use: Yes     Comment: rarely drinks wine  . Drug Use: Not on file  . Sexual Activity: Not on file   Other Topics Concern  . 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 Ganglion cyst excision Right 1970    wrist  . Appendectomy  1982  . Laparoscopic salpingo oopherectomy Left 1984    benign tumor  . Breast surgery Right 12/12/00    lumpectomy / axillary  node dissection, sentinel node bx (cancer)    Family History  Problem Relation Age of Onset  . Hypertension Mother   . Stroke Mother   . Cancer Mother 81    uterine  . Heart disease Mother     CHF  . Diabetes Father   . Lymphoma Father   . Lung disease Mother     interstital lung fibrosis  . Hypertension Brother   . Diabetes type II Brother   . Hypertension Sister     2 sisters  . Diabetes Mellitus II Sister     2 sisters    Allergies  Allergen Reactions  . Oxycodone-Acetaminophen     hallucinations    Current Outpatient Prescriptions on File Prior to Visit  Medication Sig Dispense Refill  . aspirin EC 81 MG tablet Take 1 tablet (81 mg total) by mouth daily.    Marland Kitchen CALCIUM-MAGNESIUM-VITAMIN D PO Take 1 tablet by mouth daily.    Marland Kitchen docusate sodium (COLACE) 100 MG capsule Take 100 mg by mouth 2 (two) times daily.    . Glucosamine HCl 1500 MG TABS Take 1 tablet by mouth daily.    . hydrochlorothiazide (  HYDRODIURIL) 25 MG tablet TAKE 1 TABLET BY MOUTH ONCE DAILY 90 tablet 1  . loratadine (CLARITIN) 10 MG tablet Take 10 mg by mouth daily.    . metFORMIN (GLUCOPHAGE) 500 MG tablet Take 2 tablets (1,000 mg total) by mouth 2 (two) times daily with a meal. 360 tablet 1  . naproxen sodium (ALEVE) 220 MG tablet Take 220 mg by mouth 2 (two) times daily with a meal.    . Omega-3 Fatty Acids (OMEGA 3 PO) Take 1 capsule by mouth daily.    Marland Kitchen omeprazole (PRILOSEC) 40 MG capsule Take 1 capsule (40 mg total) by mouth daily. 90 capsule 1  . OVER THE COUNTER MEDICATION Take 500 mg by mouth daily. VITAMIN C /  ROSEHIPS    . vitamin E 400 UNIT capsule Take 400 Units by mouth daily.     No current facility-administered medications on file prior to visit.    BP 109/75 mmHg  Pulse 78  Temp(Src) 98.8 F (37.1 C) (Oral)  Resp 16  Ht 5\' 5"  (1.651 m)  Wt 216 lb 12.8 oz (98.34 kg)  BMI 36.08 kg/m2  SpO2 97%    Objective:   Physical Exam  Constitutional: She is oriented to person, place,  and time. She appears well-developed and well-nourished.  HENT:  Head: Normocephalic and atraumatic.  Cardiovascular: Normal rate, regular rhythm and normal heart sounds.   No murmur heard. Pulmonary/Chest: Effort normal and breath sounds normal. No respiratory distress. She has no wheezes.  Musculoskeletal: She exhibits no edema.  Neurological: She is alert and oriented to person, place, and time.  Psychiatric: She has a normal mood and affect. Her behavior is normal. Judgment and thought content normal.          Assessment & Plan:

## 2015-10-16 NOTE — Assessment & Plan Note (Signed)
Stable on PPI, continue same.  

## 2015-10-16 NOTE — Assessment & Plan Note (Signed)
Restart lipitor

## 2015-10-16 NOTE — Patient Instructions (Signed)
Please complete lab work prior to leaving.   

## 2015-10-16 NOTE — Assessment & Plan Note (Signed)
BP stable on hctz, continue same, obtain bmet.  

## 2015-10-16 NOTE — Assessment & Plan Note (Addendum)
Clinically stable, continue metformin.  Obtain A1C and urine microalbumin. Reminded patient to schedule an eye exam.

## 2015-11-22 MED FILL — OMEPRAZOLE DR 40 MG CAPSULE: 40 | 90 days supply | Qty: 90 | Fill #0

## 2015-12-26 MED FILL — HYDROCHLOROTHIAZIDE 25 MG T: 25 | 90 days supply | Qty: 90 | Fill #0

## 2016-01-15 ENCOUNTER — Ambulatory Visit (INDEPENDENT_AMBULATORY_CARE_PROVIDER_SITE_OTHER): Payer: 59 | Admitting: Family

## 2016-01-15 ENCOUNTER — Encounter: Payer: Self-pay | Admitting: Family

## 2016-01-15 VITALS — BP 120/63 | HR 79 | Temp 98.8°F | Resp 16 | Ht 65.0 in | Wt 214.2 lb

## 2016-01-15 DIAGNOSIS — E1140 Type 2 diabetes mellitus with diabetic neuropathy, unspecified: Secondary | ICD-10-CM

## 2016-01-15 DIAGNOSIS — IMO0002 Reserved for concepts with insufficient information to code with codable children: Secondary | ICD-10-CM

## 2016-01-15 DIAGNOSIS — I10 Essential (primary) hypertension: Secondary | ICD-10-CM | POA: Diagnosis not present

## 2016-01-15 DIAGNOSIS — K219 Gastro-esophageal reflux disease without esophagitis: Secondary | ICD-10-CM

## 2016-01-15 DIAGNOSIS — E1165 Type 2 diabetes mellitus with hyperglycemia: Secondary | ICD-10-CM

## 2016-01-15 DIAGNOSIS — Z0000 Encounter for general adult medical examination without abnormal findings: Secondary | ICD-10-CM

## 2016-01-15 DIAGNOSIS — E785 Hyperlipidemia, unspecified: Secondary | ICD-10-CM

## 2016-01-15 LAB — BASIC METABOLIC PANEL
BUN: 17 mg/dL (ref 6–23)
CO2: 31 meq/L (ref 19–32)
Calcium: 10.1 mg/dL (ref 8.4–10.5)
Chloride: 101 mEq/L (ref 96–112)
Creatinine, Ser: 0.65 mg/dL (ref 0.40–1.20)
GFR: 117.61 mL/min (ref >60.00)
GLUCOSE: 108 mg/dL — AB (ref 70–99)
POTASSIUM: 3.8 meq/L (ref 3.5–5.1)
Sodium: 139 mEq/L (ref 135–145)

## 2016-01-15 LAB — HEMOGLOBIN A1C: Hgb A1c MFr Bld: 7 % — ABNORMAL HIGH (ref 4.6–6.5)

## 2016-01-15 MED ORDER — LOSARTAN POTASSIUM 25 MG PO TABS: 25.0000 mg | ORAL_TABLET | Freq: Every day | ORAL | Status: DC

## 2016-01-15 MED ORDER — OMEPRAZOLE 40 MG PO CPDR: 40.0000 mg | DELAYED_RELEASE_CAPSULE | Freq: Every day | ORAL | Status: DC

## 2016-01-15 MED FILL — LOSARTAN POTASSIUM 25 MG TA: 25 | 30 days supply | Qty: 30 | Fill #0

## 2016-01-15 NOTE — Assessment & Plan Note (Signed)
LDL at goal. Continue atorvastatin.  

## 2016-01-15 NOTE — Progress Notes (Signed)
Subjective:    Patient ID: Elizabeth Vang, female    DOB: October 01, 1950, 65 y.o.   MRN: [id number]  HPI  Elizabeth Vang is a 65 yr old female who presents today for follow up.  1) DM2- current meds include metformin 100mg  bid. Reports that she is taking 2 tabs in AM and only 1 tab in evening sometimes.  Notes that she sometimes has some nausea.    Lab Results  Component Value Date   HGBA1C 6.8* 10/16/2015   HGBA1C 7.1* 07/14/2015   HGBA1C 6.8* 04/04/2015   Lab Results  Component Value Date   MICROALBUR <0.7 10/16/2015   LDLCALC 98 07/14/2015   CREATININE 0.67 10/16/2015   2) GERD- maintained on omeprazole. Reports that if she skips 1-2 days of medications symptoms return.    3) HTN- on hctz.  Mom had cough with lisinopril.  BP Readings from Last 3 Encounters:  01/15/16 120/63  10/16/15 109/75  07/14/15 114/72   4) hyperlipidemia- maintained on atrovastatin 20mg .  Lab Results  Component Value Date   CHOL 152 07/14/2015   HDL 31.10* 07/14/2015   LDLCALC 98 07/14/2015   TRIG 112.0 07/14/2015   CHOLHDL 5 07/14/2015     Review of Systems See HPI  Past Medical History  Diagnosis Date  . Cancer ([place]) 12/12/2000    breast  . Hyperlipidemia      Social History   Social History  . Marital Status: Married    Spouse Name: N/A  . Number of Children: N/A  . Years of Education: N/A   Occupational History  . Not on file.   Social History Main Topics  . Smoking status: Never Smoker   . Smokeless tobacco: Never Used  . Alcohol Use: Yes     Comment: rarely drinks wine  . Drug Use: Not on file  . Sexual Activity: Not on file   Other Topics Concern  . 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 Ganglion cyst excision Right 1970    wrist  .  Appendectomy  1982  . Laparoscopic salpingo oopherectomy Left 1984    benign tumor  . Breast surgery Right 12/12/00    lumpectomy / axillary node dissection, sentinel node bx (cancer)    Family History  Problem Relation Age of Onset  . Hypertension Mother   . Stroke Mother   . Cancer Mother 53    uterine  . Heart disease Mother     CHF  . Diabetes Father   . Lymphoma Father   . Lung disease Mother     interstital lung fibrosis  . Hypertension Brother   . Diabetes type II Brother   . Hypertension Sister     2 sisters  . Diabetes Mellitus II Sister     2 sisters    Allergies  Allergen Reactions  . Oxycodone-Acetaminophen     hallucinations    Current Outpatient Prescriptions on File Prior to Visit  Medication Sig Dispense Refill  . aspirin EC 81 MG tablet Take 1 tablet (81 mg total) by mouth daily.    Marland Kitchen atorvastatin (LIPITOR) 20 MG tablet Take 1.5 by mouth daily 135 tablet 1  . CALCIUM-MAGNESIUM-VITAMIN D PO Take 1 tablet  by mouth daily.    Marland Kitchen docusate sodium (COLACE) 100 MG capsule Take 100 mg by mouth 2 (two) times daily.    . Glucosamine HCl 1500 MG TABS Take 1 tablet by mouth daily.    . hydrochlorothiazide (HYDRODIURIL) 25 MG tablet TAKE 1 TABLET BY MOUTH ONCE DAILY 90 tablet 1  . loratadine (CLARITIN) 10 MG tablet Take 10 mg by mouth daily.    . metFORMIN (GLUCOPHAGE) 500 MG tablet Take 2 tablets (1,000 mg total) by mouth 2 (two) times daily with a meal. 360 tablet 1  . naproxen sodium (ALEVE) 220 MG tablet Take 220 mg by mouth 2 (two) times daily with a meal.    . Omega-3 Fatty Acids (OMEGA 3 PO) Take 1 capsule by mouth daily.    Marland Kitchen omeprazole (PRILOSEC) 40 MG capsule Take 1 capsule (40 mg total) by mouth daily. 90 capsule 1  . OVER THE COUNTER MEDICATION Take 500 mg by mouth daily. VITAMIN C /  ROSEHIPS    . vitamin E 400 UNIT capsule Take 400 Units by mouth daily.     No current facility-administered medications on file prior to visit.    BP 120/63 mmHg  Pulse  79  Temp(Src) 98.8 F (37.1 C) (Oral)  Resp 16  Ht 5\' 5"  (1.651 m)  Wt 214 lb 3.2 oz (97.16 kg)  BMI 35.64 kg/m2  SpO2 99%       Objective:   Physical Exam  Constitutional: She is oriented to person, place, and time. She appears well-developed and well-nourished.  HENT:  Head: Normocephalic and atraumatic.  Cardiovascular: Normal rate, regular rhythm and normal heart sounds.   No murmur heard. Pulmonary/Chest: Effort normal and breath sounds normal. No respiratory distress. She has no wheezes.  Musculoskeletal: She exhibits no edema.  Neurological: She is alert and oriented to person, place, and time.  Psychiatric: She has a normal mood and affect. Her behavior is normal. Judgment and thought content normal.          Assessment & Plan:

## 2016-01-15 NOTE — Progress Notes (Signed)
Pre visit review using our clinic review tool, if applicable. No additional management support is needed unless otherwise documented below in the visit note. 

## 2016-01-15 NOTE — Assessment & Plan Note (Signed)
Advised pt to check sugars when she feels nauseated in the evenings.

## 2016-01-15 NOTE — Assessment & Plan Note (Signed)
Will changed hctz to low dose losartan for renal protection. Follow up in 1 month for BP recheck and bmet.

## 2016-01-15 NOTE — Assessment & Plan Note (Signed)
Stable on PPI, cannot tolerate coing off.

## 2016-01-15 NOTE — Patient Instructions (Signed)
Please complete lab work prior to leaving. You will be contacted about your referral to GI for colonoscopy. Stop HCTZ, start losartan.

## 2016-01-16 ENCOUNTER — Encounter: Payer: Self-pay | Admitting: Family

## 2016-01-16 MED FILL — metFORMIN HCL 500 MG TABS: 500 | 90 days supply | Qty: 360 | Fill #1

## 2016-01-16 MED FILL — ATORVASTATIN 20 MG TABLET: 20 | 90 days supply | Qty: 135 | Fill #1

## 2016-01-30 NOTE — Telephone Encounter (Signed)
Please contact pt re: unread mychart message. 

## 2016-02-02 NOTE — Telephone Encounter (Signed)
Left message for pt to return my call.

## 2016-02-07 NOTE — Telephone Encounter (Signed)
Notified pt. 

## 2016-02-16 MED FILL — LOSARTAN POTASSIUM 25 MG TA: 25 | 30 days supply | Qty: 30 | Fill #1

## 2016-02-19 ENCOUNTER — Ambulatory Visit (INDEPENDENT_AMBULATORY_CARE_PROVIDER_SITE_OTHER): Payer: 59 | Admitting: Family

## 2016-02-19 ENCOUNTER — Encounter: Payer: Self-pay | Admitting: Family

## 2016-02-19 VITALS — BP 120/76 | HR 76 | Temp 98.1°F | Resp 16 | Ht 65.0 in | Wt 211.2 lb

## 2016-02-19 DIAGNOSIS — I10 Essential (primary) hypertension: Secondary | ICD-10-CM

## 2016-02-19 LAB — BASIC METABOLIC PANEL
BUN: 14 mg/dL (ref 6–23)
CHLORIDE: 103 meq/L (ref 96–112)
CO2: 31 mEq/L (ref 19–32)
Calcium: 9.7 mg/dL (ref 8.4–10.5)
Creatinine, Ser: 0.69 mg/dL (ref 0.40–1.20)
GFR: 109.75 mL/min (ref >60.00)
Glucose, Bld: 104 mg/dL — ABNORMAL HIGH (ref 70–99)
POTASSIUM: 4.2 meq/L (ref 3.5–5.1)
Sodium: 141 mEq/L (ref 135–145)

## 2016-02-19 MED ORDER — GLUCOSE BLOOD VI STRP: ORAL_STRIP | Status: AC

## 2016-02-19 MED FILL — ONETOUCH VERIO TEST STRIP: 90 days supply | Qty: 100 | Fill #0

## 2016-02-19 NOTE — Assessment & Plan Note (Signed)
BP looks good. No obvious swelling today. We discussed possibility of adding lasix 20mg  daily PRN, she declines at this time. Continue losartan, obtain bmet.

## 2016-02-19 NOTE — Progress Notes (Signed)
Subjective:    Patient ID: Elizabeth Vang, female    DOB: [birth year], 65 y.o.   MRN: [id number]  HPI   [name] is a 65 yr old female who presents today for follow up. Last visit her hctz was d/c'd and instead she was placed on low dose losartan for renal protection.  Notes that her feet have been "more puffy" since she came off of the  Hctz. Denies SOB or DOE.  BP Readings from Last 3 Encounters:  02/19/16 120/76  01/15/16 120/63  10/16/15 109/75   Reports home readings 130's over low 90's.    Review of Systems See HPI  Past Medical History  Diagnosis Date  . Cancer ([place]) 12/12/2000    breast  . Hyperlipidemia      Social History   Social History  . Marital Status: Married    Spouse Name: N/A  . Number of Children: N/A  . Years of Education: N/A   Occupational History  . Not on file.   Social History Main Topics  . Smoking status: Never Smoker   . Smokeless tobacco: Never Used  . Alcohol Use: Yes     Comment: rarely drinks wine  . Drug Use: Not on file  . Sexual Activity: Not on file   Other Topics Concern  . 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 Ganglion cyst excision Right 1970    wrist  . Appendectomy  1982  . Laparoscopic salpingo oopherectomy Left 1984    benign tumor  . Breast surgery Right 12/12/00    lumpectomy / axillary node dissection, sentinel node bx (cancer)    Family History  Problem Relation Age of Onset  . Hypertension Mother   . Stroke Mother   . Cancer Mother 10    uterine  . Heart disease Mother     CHF  . Diabetes Father   . Lymphoma Father   . Lung disease Mother     interstital lung fibrosis  . Hypertension Brother   . Diabetes type II Brother   . Hypertension Sister     2 sisters  .  Diabetes Mellitus II Sister     2 sisters    Allergies  Allergen Reactions  . Oxycodone-Acetaminophen     hallucinations    Current Outpatient Prescriptions on File Prior to Visit  Medication Sig Dispense Refill  . aspirin EC 81 MG tablet Take 1 tablet (81 mg total) by mouth daily.    Marland Kitchen atorvastatin (LIPITOR) 20 MG tablet Take 1.5 by mouth daily 135 tablet 1  . CALCIUM-MAGNESIUM-VITAMIN D PO Take 1 tablet by mouth daily.    Marland Kitchen docusate sodium (COLACE) 100 MG capsule Take 100 mg by mouth 2 (two) times daily.    . Glucosamine HCl 1500 MG TABS Take 1 tablet by mouth daily.    Marland Kitchen loratadine (CLARITIN) 10 MG tablet Take 10 mg by mouth daily.    Marland Kitchen losartan (COZAAR) 25 MG tablet Take 1 tablet (25 mg total) by mouth daily. 30 tablet 2  . metFORMIN (GLUCOPHAGE) 500 MG tablet Take 2 tablets (1,000 mg total) by mouth 2 (two) times daily with a meal. 360 tablet 1  . naproxen sodium (ALEVE)  220 MG tablet Take 220 mg by mouth 2 (two) times daily with a meal.    . Omega-3 Fatty Acids (OMEGA 3 PO) Take 1 capsule by mouth daily.    Marland Kitchen omeprazole (PRILOSEC) 40 MG capsule Take 1 capsule (40 mg total) by mouth daily. 90 capsule 1  . OVER THE COUNTER MEDICATION Take 500 mg by mouth daily. VITAMIN C /  ROSEHIPS    . vitamin E 400 UNIT capsule Take 400 Units by mouth daily.     No current facility-administered medications on file prior to visit.    BP 120/76 mmHg  Pulse 76  Temp(Src) 98.1 F (36.7 C) (Oral)  Resp 16  Ht 5\' 5"  (1.651 m)  Wt 211 lb 3.2 oz (95.8 kg)  BMI 35.15 kg/m2  SpO2 98%       Objective:   Physical Exam  Constitutional: She is oriented to person, place, and time. She appears well-developed and well-nourished.  Cardiovascular: Normal rate, regular rhythm and normal heart sounds.   No murmur heard. Pulmonary/Chest: Effort normal and breath sounds normal. No respiratory distress. She has no wheezes.  Musculoskeletal: She exhibits no edema.  Neurological: She is alert and  oriented to person, place, and time.  Psychiatric: She has a normal mood and affect. Her behavior is normal. Judgment and thought content normal.          Assessment & Plan:

## 2016-02-19 NOTE — Progress Notes (Signed)
Pre visit review using our clinic review tool, if applicable. No additional management support is needed unless otherwise documented below in the visit note. 

## 2016-02-19 NOTE — Patient Instructions (Signed)
Please complete lab work prior to leaving.  Continue current medications.   

## 2016-02-20 MED FILL — OMEPRAZOLE DR 40 MG CAPSULE: 40 | 90 days supply | Qty: 90 | Fill #1

## 2016-02-22 ENCOUNTER — Ambulatory Visit (AMBULATORY_SURGERY_CENTER): Payer: Self-pay | Admitting: *Deleted

## 2016-02-22 VITALS — Ht 65.0 in | Wt 214.8 lb

## 2016-02-22 DIAGNOSIS — Z1211 Encounter for screening for malignant neoplasm of colon: Secondary | ICD-10-CM

## 2016-02-22 MED ORDER — NA SULFATE-K SULFATE-MG SULF 17.5-3.13-1.6 GM/177ML PO SOLN: ORAL | Status: DC

## 2016-03-01 ENCOUNTER — Telehealth: Payer: Self-pay | Admitting: Gastroenterology

## 2016-03-01 NOTE — Telephone Encounter (Signed)
Message sent to [name] requesting Suprep sample.  Pt notified that Magda Paganini will be calling her

## 2016-03-04 ENCOUNTER — Telehealth: Payer: Self-pay | Admitting: Gastroenterology

## 2016-03-04 NOTE — Telephone Encounter (Signed)
Called patient to come pick up sample prep kit today

## 2016-03-04 NOTE — Telephone Encounter (Signed)
Needs sample

## 2016-03-07 ENCOUNTER — Ambulatory Visit (AMBULATORY_SURGERY_CENTER): Payer: 59 | Admitting: Gastroenterology

## 2016-03-07 ENCOUNTER — Encounter: Payer: Self-pay | Admitting: Gastroenterology

## 2016-03-07 VITALS — BP 103/73 | HR 75 | Temp 97.5°F | Resp 13 | Ht 65.0 in | Wt 214.0 lb

## 2016-03-07 DIAGNOSIS — I10 Essential (primary) hypertension: Secondary | ICD-10-CM | POA: Diagnosis not present

## 2016-03-07 DIAGNOSIS — Z1211 Encounter for screening for malignant neoplasm of colon: Secondary | ICD-10-CM | POA: Diagnosis not present

## 2016-03-07 DIAGNOSIS — E119 Type 2 diabetes mellitus without complications: Secondary | ICD-10-CM | POA: Diagnosis not present

## 2016-03-07 DIAGNOSIS — K635 Polyp of colon: Secondary | ICD-10-CM | POA: Diagnosis not present

## 2016-03-07 DIAGNOSIS — K219 Gastro-esophageal reflux disease without esophagitis: Secondary | ICD-10-CM | POA: Diagnosis not present

## 2016-03-07 DIAGNOSIS — D124 Benign neoplasm of descending colon: Secondary | ICD-10-CM

## 2016-03-07 MED ORDER — SODIUM CHLORIDE 0.9 % IV SOLN: 500.0000 mL | INTRAVENOUS | Status: DC

## 2016-03-07 NOTE — Patient Instructions (Signed)
YOU HAD AN ENDOSCOPIC PROCEDURE TODAY AT THE [facility]:   Refer to the procedure report that was given to you for any specific questions about what was found during the examination.  If the procedure report does not answer your questions, please call your gastroenterologist to clarify.  If you requested that your care partner not be given the details of your procedure findings, then the procedure report has been included in a sealed envelope for you to review at your convenience later.  YOU SHOULD EXPECT: Some feelings of bloating in the abdomen. Passage of more gas than usual.  Walking can help get rid of the air that was put into your GI tract during the procedure and reduce the bloating. If you had a lower endoscopy (such as a colonoscopy or flexible sigmoidoscopy) you may notice spotting of blood in your stool or on the toilet paper. If you underwent a bowel prep for your procedure, you may not have a normal bowel movement for a few days.  Please Note:  You might notice some irritation and congestion in your nose or some drainage.  This is from the oxygen used during your procedure.  There is no need for concern and it should clear up in a day or so.  SYMPTOMS TO REPORT IMMEDIATELY:   Following lower endoscopy (colonoscopy or flexible sigmoidoscopy):  Excessive amounts of blood in the stool  Significant tenderness or worsening of abdominal pains  Swelling of the abdomen that is new, acute  Fever of 100F or higher   Following upper endoscopy (EGD)  Vomiting of blood or coffee ground material  New chest pain or pain under the shoulder blades  Painful or persistently difficult swallowing  New shortness of breath  Fever of 100F or higher  Black, tarry-looking stools  For urgent or emergent issues, a gastroenterologist can be reached at any hour by calling [phone].   DIET: Your first meal following the procedure should be a small meal and then it is ok to progress to  your normal diet. Heavy or fried foods are harder to digest and may make you feel nauseous or bloated.  Likewise, meals heavy in dairy and vegetables can increase bloating.  Drink plenty of fluids but you should avoid alcoholic beverages for 24 hours.  ACTIVITY:  You should plan to take it easy for the rest of today and you should NOT DRIVE or use heavy machinery until tomorrow (because of the sedation medicines used during the test).    FOLLOW UP: Our staff will call the number listed on your records the next business day following your procedure to check on you and address any questions or concerns that you may have regarding the information given to you following your procedure. If we do not reach you, we will leave a message.  However, if you are feeling well and you are not experiencing any problems, there is no need to return our call.  We will assume that you have returned to your regular daily activities without incident.  If any biopsies were taken you will be contacted by phone or by letter within the next 1-3 weeks.  Please call us at [phone] if you have not heard about the biopsies in 3 weeks.    SIGNATURES/CONFIDENTIALITY: You and/or your care partner have signed paperwork which will be entered into your electronic medical record.  These signatures attest to the fact that that the information above on your After Visit Summary has been reviewed   and is understood.  Full responsibility of the confidentiality of this discharge information lies with you and/or your care-partner.YOU HAD AN ENDOSCOPIC PROCEDURE TODAY AT [facility]:   Refer to the procedure report that was given to you for any specific questions about what was found during the examination.  If the procedure report does not answer your questions, please call your gastroenterologist to clarify.  If you requested that your care partner not be given the details of your procedure findings, then the procedure  report has been included in a sealed envelope for you to review at your convenience later.  YOU SHOULD EXPECT: Some feelings of bloating in the abdomen. Passage of more gas than usual.  Walking can help get rid of the air that was put into your GI tract during the procedure and reduce the bloating. If you had a lower endoscopy (such as a colonoscopy or flexible sigmoidoscopy) you may notice spotting of blood in your stool or on the toilet paper. If you underwent a bowel prep for your procedure, you may not have a normal bowel movement for a few days.  Please Note:  You might notice some irritation and congestion in your nose or some drainage.  This is from the oxygen used during your procedure.  There is no need for concern and it should clear up in a day or so.  SYMPTOMS TO REPORT IMMEDIATELY:   Following lower endoscopy (colonoscopy or flexible sigmoidoscopy):  Excessive amounts of blood in the stool  Significant tenderness or worsening of abdominal pains  Swelling of the abdomen that is new, acute  Fever of 100F or higher   Following upper endoscopy (EGD)  Vomiting of blood or coffee ground material  New chest pain or pain under the shoulder blades  Painful or persistently difficult swallowing  New shortness of breath  Fever of 100F or higher  Black, tarry-looking stools  For urgent or emergent issues, a gastroenterologist can be reached at any hour by calling [phone].   DIET: Your first meal following the procedure should be a small meal and then it is ok to progress to your normal diet. Heavy or fried foods are harder to digest and may make you feel nauseous or bloated.  Likewise, meals heavy in dairy and vegetables can increase bloating.  Drink plenty of fluids but you should avoid alcoholic beverages for 24 hours.  ACTIVITY:  You should plan to take it easy for the rest of today and you should NOT DRIVE or use heavy machinery until tomorrow (because of the sedation  medicines used during the test).    FOLLOW UP: Our staff will call the number listed on your records the next business day following your procedure to check on you and address any questions or concerns that you may have regarding the information given to you following your procedure. If we do not reach you, we will leave a message.  However, if you are feeling well and you are not experiencing any problems, there is no need to return our call.  We will assume that you have returned to your regular daily activities without incident.  If any biopsies were taken you will be contacted by phone or by letter within the next 1-3 weeks.  Please call us at [phone] if you have not heard about the biopsies in 3 weeks.    SIGNATURES/CONFIDENTIALITY: You and/or your care partner have signed paperwork which will be entered into your electronic medical record.  These signatures attest to the fact that that the information above on your After Visit Summary has been reviewed and is understood.  Full responsibility of the confidentiality of this discharge information lies with you and/or your care-partner.YOU HAD AN ENDOSCOPIC PROCEDURE TODAY AT [facility]:   Refer to the procedure report that was given to you for any specific questions about what was found during the examination.  If the procedure report does not answer your questions, please call your gastroenterologist to clarify.  If you requested that your care partner not be given the details of your procedure findings, then the procedure report has been included in a sealed envelope for you to review at your convenience later.  YOU SHOULD EXPECT: Some feelings of bloating in the abdomen. Passage of more gas than usual.  Walking can help get rid of the air that was put into your GI tract during the procedure and reduce the bloating. If you had a lower endoscopy (such as a colonoscopy or flexible sigmoidoscopy) you may notice spotting of blood  in your stool or on the toilet paper. If you underwent a bowel prep for your procedure, you may not have a normal bowel movement for a few days.  Please Note:  You might notice some irritation and congestion in your nose or some drainage.  This is from the oxygen used during your procedure.  There is no need for concern and it should clear up in a day or so.  SYMPTOMS TO REPORT IMMEDIATELY:   Following lower endoscopy (colonoscopy or flexible sigmoidoscopy):  Excessive amounts of blood in the stool  Significant tenderness or worsening of abdominal pains  Swelling of the abdomen that is new, acute  Fever of 100F or higher   For urgent or emergent issues, a gastroenterologist can be reached at any hour by calling [phone].   DIET: Your first meal following the procedure should be a small meal and then it is ok to progress to your normal diet. Heavy or fried foods are harder to digest and may make you feel nauseous or bloated.  Likewise, meals heavy in dairy and vegetables can increase bloating.  Drink plenty of fluids but you should avoid alcoholic beverages for 24 hours.  ACTIVITY:  You should plan to take it easy for the rest of today and you should NOT DRIVE or use heavy machinery until tomorrow (because of the sedation medicines used during the test).    FOLLOW UP: Our staff will call the number listed on your records the next business day following your procedure to check on you and address any questions or concerns that you may have regarding the information given to you following your procedure. If we do not reach you, we will leave a message.  However, if you are feeling well and you are not experiencing any problems, there is no need to return our call.  We will assume that you have returned to your regular daily activities without incident.  If any biopsies were taken you will be contacted by phone or by letter within the next 1-3 weeks.  Please call us at [phone] if you  have not heard about the biopsies in 3 weeks.    SIGNATURES/CONFIDENTIALITY: You and/or your care partner have signed paperwork which will be entered into your electronic medical record.  These signatures attest to the fact that that the information above on your After Visit Summary has been reviewed and is understood.  Full responsibility  of the confidentiality of this discharge information lies with you and/or your care-partner.  Read all of the handouts given to you by your recovery room nurse.  Thank-you for choosing [place] for your healthcare needs today.

## 2016-03-07 NOTE — Progress Notes (Signed)
Called to room to assist during endoscopic procedure.  Patient ID and intended procedure confirmed with present staff. Received instructions for my participation in the procedure from the performing physician.  

## 2016-03-07 NOTE — Progress Notes (Signed)
Report to PACU, RN, vss, BBS= Clear.  

## 2016-03-07 NOTE — Op Note (Signed)
[facility] Patient Name: Elizabeth Vang Procedure Date: 03/07/2016 7:59 AM MRN: [id number] Endoscopist: Mauri Pole , MD Age: 65 Referring MD:  Date of Birth: 1950/10/14 Gender: Female Account #: [id number] Procedure:                Colonoscopy Indications:              Screening for colorectal malignant neoplasm,                            Screening for colon cancer: Family history of                            colorectal cancer in distant relative(s), Last                            colonoscopy: 2004 Medicines:                Monitored Anesthesia Care Procedure:                Pre-Anesthesia Assessment:                           - Prior to the procedure, a History and Physical                            was performed, and patient medications and                            allergies were reviewed. The patient's tolerance of                            previous anesthesia was also reviewed. The risks                            and benefits of the procedure and the sedation                            options and risks were discussed with the patient.                            All questions were answered, and informed consent                            was obtained. Prior Anticoagulants: The patient                            last took aspirin on the day of the procedure. ASA                            Grade Assessment: II - A patient with mild systemic                            disease. After reviewing the risks and benefits,  the patient was deemed in satisfactory condition to                            undergo the procedure.                           After obtaining informed consent, the colonoscope                            was passed under direct vision. Throughout the                            procedure, the patient's blood pressure, pulse, and                            oxygen saturations were monitored continuously. The                       Model CF-HQ190L [id number]) scope was introduced                            through the anus and advanced to the the terminal                            ileum, with identification of the appendiceal                            orifice and IC valve. The colonoscopy was performed                            without difficulty. The patient tolerated the                            procedure well. The quality of the bowel                            preparation was good. The terminal ileum, ileocecal                            valve, appendiceal orifice, and rectum were                            photographed. Scope In: 8:43:30 AM Scope Out: 9:05:42 AM Scope Withdrawal Time: 0 hours 11 minutes 41 seconds  Total Procedure Duration: 0 hours 22 minutes 12 seconds  Findings:                 The perianal and digital rectal examinations were                            normal.                           A 2 mm polyp was found in the descending colon. The  polyp was sessile. The polyp was removed with a                            cold biopsy forceps. Resection and retrieval were                            complete.                           Non-bleeding internal hemorrhoids were found during                            retroflexion. The hemorrhoids were small.                           The exam was otherwise without abnormality. Complications:            No immediate complications. Estimated Blood Loss:     Estimated blood loss was minimal. Impression:               - One 2 mm polyp in the descending colon, removed                            with a cold biopsy forceps. Resected and retrieved.                           - Non-bleeding internal hemorrhoids.                           - The examination was otherwise normal. Recommendation:           - Patient has a contact number available for                            emergencies. The signs and symptoms of  potential                            delayed complications were discussed with the                            patient. Return to normal activities tomorrow.                            Written discharge instructions were provided to the                            patient.                           - Resume previous diet.                           - Continue present medications.                           - Await pathology results.                           -  Repeat colonoscopy in 5-10 years for surveillance                            based on pathology. Mauri Pole, MD 03/07/2016 9:14:32 AM This report has been signed electronically.

## 2016-03-08 ENCOUNTER — Telehealth: Payer: Self-pay | Admitting: *Deleted

## 2016-03-08 NOTE — Telephone Encounter (Signed)
  Follow up Call-  Call back number 03/07/2016  Post procedure Call Back phone  # [phone]  Permission to leave phone message Yes     Patient questions:  Do you have a fever, pain , or abdominal swelling? No. Pain Score  0 *  Have you tolerated food without any problems? Yes.    Have you been able to return to your normal activities? Yes.    Do you have any questions about your discharge instructions: Diet   No. Medications  No. Follow up visit  No.  Do you have questions or concerns about your Care? No.  Actions: * If pain score is 4 or above: No action needed, pain <4.

## 2016-03-13 ENCOUNTER — Encounter: Payer: Self-pay | Admitting: Gastroenterology

## 2016-03-15 MED FILL — LOSARTAN POTASSIUM 25 MG TA: 25 | 30 days supply | Qty: 30 | Fill #2

## 2016-04-16 ENCOUNTER — Other Ambulatory Visit: Payer: Self-pay | Admitting: Family

## 2016-04-16 MED FILL — ATORVASTATIN 20 MG TABLET: 20 | 90 days supply | Qty: 135 | Fill #0

## 2016-04-16 MED FILL — metFORMIN HCL 500 MG TABS: 500 | 90 days supply | Qty: 360 | Fill #0

## 2016-04-16 MED FILL — LOSARTAN POTASSIUM 25 MG TA: 25 | 90 days supply | Qty: 90 | Fill #0

## 2016-05-08 ENCOUNTER — Ambulatory Visit (INDEPENDENT_AMBULATORY_CARE_PROVIDER_SITE_OTHER): Payer: 59 | Admitting: Family

## 2016-05-08 VITALS — BP 135/89 | HR 86 | Temp 98.6°F | Resp 18 | Ht 65.0 in | Wt 208.6 lb

## 2016-05-08 DIAGNOSIS — IMO0002 Reserved for concepts with insufficient information to code with codable children: Secondary | ICD-10-CM

## 2016-05-08 DIAGNOSIS — E1165 Type 2 diabetes mellitus with hyperglycemia: Secondary | ICD-10-CM

## 2016-05-08 DIAGNOSIS — I10 Essential (primary) hypertension: Secondary | ICD-10-CM

## 2016-05-08 DIAGNOSIS — E1140 Type 2 diabetes mellitus with diabetic neuropathy, unspecified: Secondary | ICD-10-CM | POA: Diagnosis not present

## 2016-05-08 DIAGNOSIS — L729 Follicular cyst of the skin and subcutaneous tissue, unspecified: Secondary | ICD-10-CM | POA: Diagnosis not present

## 2016-05-08 DIAGNOSIS — E119 Type 2 diabetes mellitus without complications: Secondary | ICD-10-CM | POA: Diagnosis not present

## 2016-05-08 DIAGNOSIS — Q181 Preauricular sinus and cyst: Secondary | ICD-10-CM

## 2016-05-08 DIAGNOSIS — Z0000 Encounter for general adult medical examination without abnormal findings: Secondary | ICD-10-CM

## 2016-05-08 LAB — BASIC METABOLIC PANEL
BUN: 13 mg/dL (ref 6–23)
CO2: 31 mEq/L (ref 19–32)
CREATININE: 0.65 mg/dL (ref 0.40–1.20)
Calcium: 9.4 mg/dL (ref 8.4–10.5)
Chloride: 102 mEq/L (ref 96–112)
GFR: 117.5 mL/min (ref >60.00)
GLUCOSE: 109 mg/dL — AB (ref 70–99)
POTASSIUM: 4.4 meq/L (ref 3.5–5.1)
Sodium: 139 mEq/L (ref 135–145)

## 2016-05-08 LAB — HEMOGLOBIN A1C: Hgb A1c MFr Bld: 6.3 % (ref 4.6–6.5)

## 2016-05-08 NOTE — Patient Instructions (Addendum)
Please complete lab work prior to leaving. Schedule your mammogram on the first floor.

## 2016-05-08 NOTE — Progress Notes (Signed)
Pre visit review using our clinic review tool, if applicable. No additional management support is needed unless otherwise documented below in the visit note. 

## 2016-05-08 NOTE — Progress Notes (Signed)
Subjective:    Patient ID: Elizabeth Vang, female    DOB: 09/22/1950, 65 y.o.   MRN: [id number]  HPI  Elizabeth Vang is a 65 yr old female who presents today for follow up.  1) HTN- currently maintained on losartan.  BP Readings from Last 3 Encounters:  05/08/16 135/89  03/07/16 103/73  02/19/16 120/76   2)  DM2- metformin. + exercise (some) eye exam due.   Lab Results  Component Value Date   HGBA1C 7.0 (H) 01/15/2016   HGBA1C 6.8 (H) 10/16/2015   HGBA1C 7.1 (H) 07/14/2015   Lab Results  Component Value Date   MICROALBUR <0.7 10/16/2015   LDLCALC 98 07/14/2015   CREATININE 0.69 02/19/2016   3) Ear pain-  1 month ago noted the outside of her ear was red. Then noted a "bump" on the anterior ear. Had some swelling near the cheek and of the lymph nodes.  Then began to drain and became tender.  She applied neosporin, betadine.  Pain is nearly resolved.   Review of Systems See HPI  Past Medical History:  Diagnosis Date  . Arthritis   . Cancer ([facility]) 12/12/2000   breast  . Diabetes ([place])   . Environmental allergies   . GERD (gastroesophageal reflux disease)   . Hyperlipidemia   . Hypertension   . Neuropathy ([facility])    fingers and toes  . Osteoporosis      Social History   Social History  . Marital status: Married    Spouse name: N/A  . Number of children: N/A  . Years of education: N/A   Occupational History  . Not on file.   Social History Main Topics  . Smoking status: Never Smoker  . Smokeless tobacco: Never Used  . Alcohol use 0.0 oz/week     Comment: rarely drinks wine  . Drug use: Unknown  . Sexual activity: Not on file   Other Topics Concern  . 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 APPENDECTOMY  1982  . BREAST LUMPECTOMY  Right 12/12/00   lumpectomy / axillary node dissection, sentinel node bx (cancer)  . GANGLION CYST EXCISION Right 1970   wrist  . LAPAROSCOPIC SALPINGO OOPHERECTOMY  1984   benign tumor    Family History  Problem Relation Age of Onset  . Hypertension Mother   . Stroke Mother   . Cancer Mother 25    uterine  . Heart disease Mother     CHF  . Lung disease Mother     interstital lung fibrosis  . Diabetes Father   . Lymphoma Father   . Hypertension Brother   . Diabetes type II Brother   . Hypertension Sister     2 sisters  . Diabetes Mellitus II Sister     2 sisters  . Colon cancer Paternal Grandfather 66    Allergies  Allergen Reactions  . Oxycodone-Acetaminophen     hallucinations    Current Outpatient Prescriptions on File Prior to Visit  Medication Sig Dispense Refill  . aspirin EC 81 MG tablet Take 1 tablet (81 mg total) by mouth daily.    Marland Kitchen atorvastatin (LIPITOR) 20 MG tablet TAKE 1 AND 1/2 TABLETS BY MOUTH  DAILY 135 tablet 1  . CALCIUM-MAGNESIUM-VITAMIN D PO Take 1 tablet by mouth daily.    . Cholecalciferol (VITAMIN D3) 5000 UNIT/ML LIQD Take by mouth once a week.    . docusate sodium (COLACE) 100 MG capsule Take 100 mg by mouth 2 (two) times daily.    . Glucosamine HCl 1500 MG TABS Take 1 tablet by mouth daily.    Marland Kitchen glucose blood (ONETOUCH VERIO) test strip Use as instructed to check blood sugar once a day. DX E11.40 100 each 1  . loratadine (CLARITIN) 10 MG tablet Take 10 mg by mouth daily.    Marland Kitchen losartan (COZAAR) 25 MG tablet TAKE 1 TABLET BY MOUTH DAILY. 30 tablet 2  . metFORMIN (GLUCOPHAGE) 500 MG tablet TAKE 2 TABLETS BY MOUTH TWICE A DAY WITH A MEAL 360 tablet 1  . naproxen sodium (ALEVE) 220 MG tablet Take 220 mg by mouth 2 (two) times daily with a meal.    . Omega-3 Fatty Acids (OMEGA 3 PO) Take 1 capsule by mouth daily.    Marland Kitchen omeprazole (PRILOSEC) 40 MG capsule Take 1 capsule (40 mg total) by mouth daily. 90 capsule 1  . OVER THE COUNTER MEDICATION Take  500 mg by mouth daily. VITAMIN C /  ROSEHIPS    . vitamin E 400 UNIT capsule Take 400 Units by mouth daily.     No current facility-administered medications on file prior to visit.     BP 135/89 (BP Location: Left Arm)   Pulse 86   Temp 98.6 F (37 C) (Oral)   Resp 18   Ht 5\' 5"  (1.651 m)   Wt 208 lb 9.6 oz (94.6 kg)   SpO2 97% Comment: room air  BMI 34.71 kg/m       Objective:   Physical Exam  Constitutional: She is oriented to person, place, and time. She appears well-developed and well-nourished.  HENT:  Head: Normocephalic and atraumatic.  Right Ear: Tympanic membrane and ear canal normal.  Left Ear: Tympanic membrane and ear canal normal.  Small scar noted inner tragus, no erythema, no drainage  Cardiovascular: Normal rate, regular rhythm and normal heart sounds.   No murmur heard. Pulmonary/Chest: Effort normal and breath sounds normal. No respiratory distress. She has no wheezes.  Musculoskeletal: She exhibits no edema.  Neurological: She is alert and oriented to person, place, and time.  Psychiatric: She has a normal mood and affect. Her behavior is normal. Judgment and thought content normal.          Assessment & Plan:  Ear cyst- sounds like she had a cyst which was associated with some mild cellulitis and ultimately drained. Appears resolved at this time. Pt is advised to let us know if she has recurrent symptoms.

## 2016-05-08 NOTE — Assessment & Plan Note (Signed)
Stable on current meds, continue same.  

## 2016-05-08 NOTE — Assessment & Plan Note (Signed)
Clinically stable. She wishes to schedule her own eye exam.  Continue current meds and will obtain A1C.

## 2016-05-10 ENCOUNTER — Encounter: Payer: Self-pay | Admitting: Family

## 2016-05-21 ENCOUNTER — Ambulatory Visit: Payer: 59 | Admitting: Family

## 2016-06-20 MED FILL — OMEPRAZOLE DR 40 MG CAPSULE: 40 | 90 days supply | Qty: 90 | Fill #0

## 2016-07-15 ENCOUNTER — Other Ambulatory Visit: Payer: Self-pay | Admitting: Family

## 2016-07-15 MED FILL — ATORVASTATIN 20 MG TABLET: 20 | 90 days supply | Qty: 135 | Fill #1

## 2016-07-15 MED FILL — LOSARTAN POTASSIUM 25 MG TA: 25 | 90 days supply | Qty: 90 | Fill #0

## 2016-07-15 MED FILL — metFORMIN HCL 500 MG TABS: 500 | 90 days supply | Qty: 360 | Fill #1

## 2016-07-15 NOTE — Telephone Encounter (Signed)
Refill sent per [facility] refill protocol/SLS  

## 2016-08-12 ENCOUNTER — Other Ambulatory Visit (HOSPITAL_COMMUNITY)
Admission: RE | Admit: 2016-08-12 | Discharge: 2016-08-12 | Disposition: A | Discharge status: Home / Self Care | Payer: 59 | Source: Ambulatory Visit | Attending: Family | Admitting: Family

## 2016-08-12 ENCOUNTER — Ambulatory Visit: Payer: 59 | Admitting: Family

## 2016-08-12 ENCOUNTER — Ambulatory Visit (HOSPITAL_BASED_OUTPATIENT_CLINIC_OR_DEPARTMENT_OTHER)
Admission: RE | Admit: 2016-08-12 | Discharge: 2016-08-12 | Disposition: A | Discharge status: Home / Self Care | Payer: 59 | Source: Ambulatory Visit | Attending: Family | Admitting: Family

## 2016-08-12 ENCOUNTER — Encounter: Payer: Self-pay | Admitting: Family

## 2016-08-12 ENCOUNTER — Ambulatory Visit (INDEPENDENT_AMBULATORY_CARE_PROVIDER_SITE_OTHER): Payer: 59 | Admitting: Family

## 2016-08-12 VITALS — BP 125/87 | HR 73 | Temp 98.4°F | Resp 18 | Ht 65.0 in | Wt 209.0 lb

## 2016-08-12 DIAGNOSIS — Z1151 Encounter for screening for human papillomavirus (HPV): Secondary | ICD-10-CM | POA: Insufficient documentation

## 2016-08-12 DIAGNOSIS — Z01419 Encounter for gynecological examination (general) (routine) without abnormal findings: Secondary | ICD-10-CM | POA: Insufficient documentation

## 2016-08-12 DIAGNOSIS — I10 Essential (primary) hypertension: Secondary | ICD-10-CM | POA: Diagnosis not present

## 2016-08-12 DIAGNOSIS — N898 Other specified noninflammatory disorders of vagina: Secondary | ICD-10-CM

## 2016-08-12 DIAGNOSIS — Z1231 Encounter for screening mammogram for malignant neoplasm of breast: Secondary | ICD-10-CM | POA: Diagnosis not present

## 2016-08-12 DIAGNOSIS — E1140 Type 2 diabetes mellitus with diabetic neuropathy, unspecified: Secondary | ICD-10-CM

## 2016-08-12 DIAGNOSIS — E118 Type 2 diabetes mellitus with unspecified complications: Secondary | ICD-10-CM | POA: Diagnosis not present

## 2016-08-12 DIAGNOSIS — Z23 Encounter for immunization: Secondary | ICD-10-CM

## 2016-08-12 DIAGNOSIS — E1165 Type 2 diabetes mellitus with hyperglycemia: Secondary | ICD-10-CM | POA: Diagnosis not present

## 2016-08-12 DIAGNOSIS — Z0000 Encounter for general adult medical examination without abnormal findings: Secondary | ICD-10-CM

## 2016-08-12 DIAGNOSIS — IMO0002 Reserved for concepts with insufficient information to code with codable children: Secondary | ICD-10-CM

## 2016-08-12 LAB — HEPATIC FUNCTION PANEL
ALT: 20 U/L (ref 0–35)
AST: 21 U/L (ref 0–37)
Albumin: 4.3 g/dL (ref 3.5–5.2)
Alkaline Phosphatase: 63 U/L (ref 39–117)
Bilirubin, Direct: 0.1 mg/dL (ref 0.0–0.3)
Total Bilirubin: 0.3 mg/dL (ref 0.2–1.2)
Total Protein: 7.4 g/dL (ref 6.0–8.3)

## 2016-08-12 LAB — TSH: TSH: 1.68 u[IU]/mL (ref 0.35–4.50)

## 2016-08-12 LAB — BASIC METABOLIC PANEL
BUN: 12 mg/dL (ref 6–23)
CO2: 28 mEq/L (ref 19–32)
Calcium: 9.1 mg/dL (ref 8.4–10.5)
Chloride: 102 mEq/L (ref 96–112)
Creatinine, Ser: 0.65 mg/dL (ref 0.40–1.20)
GFR: 117.4 mL/min (ref >60.00)
Glucose, Bld: 102 mg/dL — ABNORMAL HIGH (ref 70–99)
Potassium: 4.2 mEq/L (ref 3.5–5.1)
Sodium: 141 mEq/L (ref 135–145)

## 2016-08-12 LAB — URINALYSIS, ROUTINE W REFLEX MICROSCOPIC
Bilirubin Urine: NEGATIVE
Hgb urine dipstick: NEGATIVE
Ketones, ur: NEGATIVE
LEUKOCYTES UA: NEGATIVE
Nitrite: NEGATIVE
PH: 7.5 (ref 5.0–8.0)
RBC / HPF: NONE SEEN (ref 0–?)
SPECIFIC GRAVITY, URINE: 1.01 (ref 1.000–1.030)
TOTAL PROTEIN, URINE-UPE24: NEGATIVE
URINE GLUCOSE: NEGATIVE
Urobilinogen, UA: 0.2 (ref 0.0–1.0)
WBC, UA: NONE SEEN (ref 0–?)

## 2016-08-12 LAB — HEMOGLOBIN A1C: HEMOGLOBIN A1C: 6.4 % (ref 4.6–6.5)

## 2016-08-12 LAB — CBC WITH DIFFERENTIAL/PLATELET
Basophils Absolute: 0 10*3/uL (ref 0.0–0.1)
Basophils Relative: 0.5 % (ref 0.0–3.0)
Eosinophils Absolute: 0.2 10*3/uL (ref 0.0–0.7)
Eosinophils Relative: 3.5 % (ref 0.0–5.0)
HCT: 39 % (ref 36.0–46.0)
Hemoglobin: 12.4 g/dL (ref 12.0–15.0)
Lymphocytes Relative: 40 % (ref 12.0–46.0)
Lymphs Abs: 2.8 10*3/uL (ref 0.7–4.0)
MCHC: 31.8 g/dL (ref 30.0–36.0)
MCV: 74.6 fl — ABNORMAL LOW (ref 78.0–100.0)
Monocytes Absolute: 0.5 10*3/uL (ref 0.1–1.0)
Monocytes Relative: 7.7 % (ref 3.0–12.0)
Neutro Abs: 3.3 10*3/uL (ref 1.4–7.7)
Neutrophils Relative %: 48.3 % (ref 43.0–77.0)
Platelets: 238 10*3/uL (ref 150.0–400.0)
RBC: 5.23 Mil/uL — ABNORMAL HIGH (ref 3.87–5.11)
RDW: 15.2 % (ref 11.5–15.5)
WBC: 6.9 10*3/uL (ref 4.0–10.5)

## 2016-08-12 LAB — LIPID PANEL
Cholesterol: 158 mg/dL (ref 0–200)
HDL: 36.5 mg/dL — ABNORMAL LOW (ref >39.00)
LDL CALC: 108 mg/dL — AB (ref 0–99)
NONHDL: 121.04
Total CHOL/HDL Ratio: 4
Triglycerides: 63 mg/dL (ref 0.0–149.0)
VLDL: 12.6 mg/dL (ref 0.0–40.0)

## 2016-08-12 NOTE — Progress Notes (Signed)
Subjective:    Patient ID: Elizabeth Vang, female    DOB: 1950-11-24, 65 y.o.   MRN: [id number]  HPI  Patient presents today for complete physical.  Immunizations: due for prevnar Diet: good Exercise: some exercise, needs improvement Colonoscopy: 03/07/16 Dexa: declines Pap Smear: due Mammogram: today  DM2- maintained on metformin. Reports sugars at home have been <108. Lab Results  Component Value Date   HGBA1C 6.3 05/08/2016   HGBA1C 7.0 (H) 01/15/2016   HGBA1C 6.8 (H) 10/16/2015   Lab Results  Component Value Date   MICROALBUR <0.7 10/16/2015   LDLCALC 98 07/14/2015   CREATININE 0.65 05/08/2016   HTN- maintained on losartan.  BP Readings from Last 3 Encounters:  08/12/16 125/87  05/08/16 135/89  03/07/16 103/73   Wt Readings from Last 3 Encounters:  08/12/16 209 lb (94.8 kg)  05/08/16 208 lb 9.6 oz (94.6 kg)  03/07/16 214 lb (97.1 kg)       Review of Systems  Constitutional: Negative for unexpected weight change.  HENT: Negative for hearing loss and rhinorrhea.   Eyes: Negative for visual disturbance.  Respiratory: Negative for cough.   Cardiovascular: Negative for chest pain and leg swelling.  Gastrointestinal: Negative for diarrhea.       Occasional constipation. Uses stool softener  Genitourinary: Negative for dysuria, frequency and hematuria.  Musculoskeletal: Negative for myalgias.       Has some chronic left hip and right knee pain  Skin: Negative for rash.  Neurological: Negative for headaches.  Hematological: Negative for adenopathy.  Psychiatric/Behavioral:       Denies depression/anxiety   Past Medical History:  Diagnosis Date  . Arthritis   . Cancer ([place]) 12/12/2000   breast  . Diabetes ([place])   . Environmental allergies   . GERD (gastroesophageal reflux disease)   . Hyperlipidemia   . Hypertension   . Neuropathy ([facility])    fingers and toes  . Osteoporosis      Social History   Social History  . Marital status: Married    Spouse name: N/A  . Number of children: N/A  . Years of education: N/A   Occupational History  . Not on file.   Social History Main Topics  . Smoking status: Never Smoker  . Smokeless tobacco: Never Used  . Alcohol use 0.0 oz/week     Comment: rarely drinks wine  . Drug use: Unknown  . Sexual activity: Not on file   Other Topics Concern  . 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 APPENDECTOMY  1982  . BREAST LUMPECTOMY Right 12/12/00   lumpectomy / axillary node dissection, sentinel node bx (cancer)  . GANGLION CYST EXCISION Right 1970   wrist  . LAPAROSCOPIC SALPINGO OOPHERECTOMY  1984   benign tumor    Family History  Problem Relation Age of Onset  . Hypertension Mother   . Stroke Mother   . Cancer Mother 95    uterine  . Heart disease Mother     CHF  . Lung disease Mother     interstital lung fibrosis  . Diabetes Father   . Lymphoma Father   . Colon cancer Paternal Grandfather 71  . Hypertension Brother   .  Diabetes type II Brother   . Hypertension Sister     2 sisters  . Diabetes Mellitus II Sister     2 sisters    Allergies  Allergen Reactions  . Oxycodone-Acetaminophen     hallucinations    Current Outpatient Prescriptions on File Prior to Visit  Medication Sig Dispense Refill  . aspirin EC 81 MG tablet Take 1 tablet (81 mg total) by mouth daily.    Marland Kitchen atorvastatin (LIPITOR) 20 MG tablet TAKE 1 AND 1/2 TABLETS BY MOUTH DAILY 135 tablet 1  . CALCIUM-MAGNESIUM-VITAMIN D PO Take 1 tablet by mouth daily.    . Cholecalciferol (VITAMIN D3) 5000 UNIT/ML LIQD Take by mouth once a week.    . docusate sodium (COLACE) 100 MG capsule Take 100 mg by mouth 2 (two) times daily.    . Glucosamine HCl 1500 MG TABS Take 1 tablet by mouth daily.      Marland Kitchen glucose blood (ONETOUCH VERIO) test strip Use as instructed to check blood sugar once a day. DX E11.40 100 each 1  . loratadine (CLARITIN) 10 MG tablet Take 10 mg by mouth daily.    Marland Kitchen losartan (COZAAR) 25 MG tablet TAKE 1 TABLET BY MOUTH DAILY. 30 tablet 2  . metFORMIN (GLUCOPHAGE) 500 MG tablet TAKE 2 TABLETS BY MOUTH TWICE A DAY WITH A MEAL 360 tablet 1  . naproxen sodium (ALEVE) 220 MG tablet Take 220 mg by mouth 2 (two) times daily with a meal.    . Omega-3 Fatty Acids (OMEGA 3 PO) Take 1 capsule by mouth daily.    Marland Kitchen omeprazole (PRILOSEC) 40 MG capsule Take 1 capsule (40 mg total) by mouth daily. 90 capsule 1  . OVER THE COUNTER MEDICATION Take 500 mg by mouth daily. VITAMIN C /  ROSEHIPS    . vitamin E 400 UNIT capsule Take 400 Units by mouth daily.     No current facility-administered medications on file prior to visit.     BP 125/87 (BP Location: Right Arm, Cuff Size: Large)   Pulse 73   Temp 98.4 F (36.9 C) (Oral)   Resp 18   Ht 5\' 5"  (1.651 m)   Wt 209 lb (94.8 kg)   SpO2 99% Comment: room air  BMI 34.78 kg/m       Objective:   Physical Exam  Physical Exam  Constitutional: She is oriented to person, place, and time. She appears well-developed and well-nourished. No distress.  HENT:  Head: Normocephalic and atraumatic.  Right Ear: Tympanic membrane and ear canal normal.  Left Ear: Tympanic membrane and ear canal normal.  Mouth/Throat: Oropharynx is clear and moist.  Eyes: Pupils are equal, round, and reactive to light. No scleral icterus.  Neck: Normal range of motion. No thyromegaly present.  Cardiovascular: Normal rate and regular rhythm.   No murmur heard. Pulmonary/Chest: Effort normal and breath sounds normal. No respiratory distress. He has no wheezes. She has no rales. She exhibits no tenderness.  Abdominal: Soft. Bowel sounds are normal. She exhibits no distension and no mass. There is no tenderness. There is no rebound and no guarding.  Musculoskeletal:  She exhibits no edema.  Lymphadenopathy:    She has no cervical adenopathy.  Neurological: She is alert and oriented to person, place, and time. She has normal patellar reflexes. She exhibits normal muscle tone. Coordination normal.  Skin: Skin is warm and dry.  Psychiatric: She has a normal mood and affect. Her behavior is normal. Judgment and thought content normal.  Breasts: Examined lying Right: some thickening right breast at 3 oclock surrounding lumpectomy site. Left: Without masses, retractions, discharge or axillary adenopathy.  Inguinal/mons: Normal without inguinal adenopathy  External genitalia: Normal  BUS/Urethra/Skene's glands: Normal  Bladder: Normal  Vagina: Normal  (+vaginal odor noted) Cervix: Normal  Uterus: normal in size, shape and contour. Midline and mobile  Adnexa/parametria:  Rt: Without masses or tenderness.  Lt: Without masses or tenderness.  Anus and perineum: Normal            Assessment & Plan:  Preventative care- discussed healthy diet and exercise. Prevnar today. Obtain routine lab work. Pap performed. Will check for gardnerella due to odor noted on exam.         Assessment & Plan:

## 2016-08-12 NOTE — Progress Notes (Signed)
Pre visit review using our clinic review tool, if applicable. No additional management support is needed unless otherwise documented below in the visit note. 

## 2016-08-12 NOTE — Assessment & Plan Note (Signed)
Stable on current medications. Continue same. 

## 2016-08-12 NOTE — Assessment & Plan Note (Signed)
Clinically stable, obtain a1c.  

## 2016-08-12 NOTE — Patient Instructions (Addendum)
Please complete lab work prior to leaving. Continue to work on healthy diet, exercise and weight loss.  

## 2016-08-14 LAB — CYTOLOGY - PAP
Diagnosis: NEGATIVE
HPV: NOT DETECTED

## 2016-08-15 ENCOUNTER — Other Ambulatory Visit: Payer: Self-pay | Admitting: Family

## 2016-08-15 LAB — CERVICOVAGINAL ANCILLARY ONLY: BACTERIAL VAGINITIS: NEGATIVE

## 2016-08-15 MED ORDER — ATORVASTATIN CALCIUM 40 MG PO TABS: 40.0000 mg | ORAL_TABLET | Freq: Every day | ORAL | 3 refills | Status: DC

## 2016-08-15 NOTE — Telephone Encounter (Signed)
Patient notified of lab results and new rx sent to [facility].

## 2016-08-15 NOTE — Telephone Encounter (Signed)
LDL above goal. Increase lipitor from 30mg  to 40mg . Sugar is improved.  Pap negative.

## 2016-08-23 MED FILL — ATORVASTATIN 40 MG TABLET: 40 | 30 days supply | Qty: 30 | Fill #0

## 2016-09-23 MED FILL — ATORVASTATIN 40 MG TABLET: 40 | 90 days supply | Qty: 90 | Fill #1

## 2016-09-23 MED FILL — OMEPRAZOLE DR 40 MG CAPSULE: 40 | 90 days supply | Qty: 90 | Fill #1

## 2016-10-21 ENCOUNTER — Telehealth: Payer: Self-pay | Admitting: Family

## 2016-10-21 MED ORDER — ATORVASTATIN CALCIUM 40 MG PO TABS: 40.0000 mg | ORAL_TABLET | Freq: Every day | ORAL | 1 refills | Status: DC

## 2016-10-21 MED ORDER — OMEPRAZOLE 40 MG PO CPDR: 40.0000 mg | DELAYED_RELEASE_CAPSULE | Freq: Every day | ORAL | 1 refills | Status: DC

## 2016-10-21 MED ORDER — LOSARTAN POTASSIUM 25 MG PO TABS: 25.0000 mg | ORAL_TABLET | Freq: Every day | ORAL | 1 refills | Status: DC

## 2016-10-21 MED ORDER — METFORMIN HCL 500 MG PO TABS: ORAL_TABLET | ORAL | 1 refills | Status: DC

## 2016-10-21 NOTE — Telephone Encounter (Signed)
Refills sent, notified pt. 

## 2016-10-21 NOTE — Telephone Encounter (Signed)
Caller name: Relationship to patient: Self Can be reached: [phone]  Pharmacy:  [place], [place] to [facility] [phone] (Phone) [phone] (Fax)     Reason for call: Refill losartan (COZAAR) 25 MG tablet  atorvastatin (LIPITOR) 40 MG tablet  metFORMIN (GLUCOPHAGE) 500 MG tablet  omeprazole (PRILOSEC) 40 MG capsule   90 day supplies

## 2016-12-09 ENCOUNTER — Encounter: Payer: Self-pay | Admitting: Family

## 2016-12-09 ENCOUNTER — Ambulatory Visit (INDEPENDENT_AMBULATORY_CARE_PROVIDER_SITE_OTHER): Payer: Medicare Other | Admitting: Family

## 2016-12-09 VITALS — BP 146/85 | Temp 98.4°F | Resp 16 | Ht 65.0 in | Wt 201.6 lb

## 2016-12-09 DIAGNOSIS — E119 Type 2 diabetes mellitus without complications: Secondary | ICD-10-CM

## 2016-12-09 DIAGNOSIS — E1140 Type 2 diabetes mellitus with diabetic neuropathy, unspecified: Secondary | ICD-10-CM | POA: Diagnosis not present

## 2016-12-09 DIAGNOSIS — E1165 Type 2 diabetes mellitus with hyperglycemia: Secondary | ICD-10-CM | POA: Diagnosis not present

## 2016-12-09 DIAGNOSIS — E785 Hyperlipidemia, unspecified: Secondary | ICD-10-CM

## 2016-12-09 DIAGNOSIS — I10 Essential (primary) hypertension: Secondary | ICD-10-CM | POA: Diagnosis not present

## 2016-12-09 DIAGNOSIS — IMO0002 Reserved for concepts with insufficient information to code with codable children: Secondary | ICD-10-CM

## 2016-12-09 LAB — BASIC METABOLIC PANEL
BUN: 18 mg/dL (ref 6–23)
CO2: 31 mEq/L (ref 19–32)
Calcium: 10 mg/dL (ref 8.4–10.5)
Chloride: 103 mEq/L (ref 96–112)
Creatinine, Ser: 0.74 mg/dL (ref 0.40–1.20)
GFR: 100.98 mL/min (ref >60.00)
Glucose, Bld: 116 mg/dL — ABNORMAL HIGH (ref 70–99)
POTASSIUM: 4.6 meq/L (ref 3.5–5.1)
Sodium: 141 mEq/L (ref 135–145)

## 2016-12-09 LAB — HEMOGLOBIN A1C: Hgb A1c MFr Bld: 6.5 % (ref 4.6–6.5)

## 2016-12-09 LAB — LIPID PANEL
CHOLESTEROL: 143 mg/dL (ref 0–200)
HDL: 38.4 mg/dL — ABNORMAL LOW (ref >39.00)
LDL CALC: 90 mg/dL (ref 0–99)
NonHDL: 104.34
TRIGLYCERIDES: 72 mg/dL (ref 0.0–149.0)
Total CHOL/HDL Ratio: 4
VLDL: 14.4 mg/dL (ref 0.0–40.0)

## 2016-12-09 NOTE — Progress Notes (Signed)
Subjective:    Patient ID: Elizabeth Vang, female    DOB: [birth year], 66 y.o.   MRN: [id number]  HPI   [name] is a 66 yr old female who presents today for follow up.   DM2- reports sugars upper 90- low 100's. No hypoglycemia.  Maintained on metformin.   Lab Results  Component Value Date   HGBA1C 6.4 08/12/2016   HGBA1C 6.3 05/08/2016   HGBA1C 7.0 (H) 01/15/2016   Lab Results  Component Value Date   MICROALBUR <0.7 10/16/2015   LDLCALC 108 (H) 08/12/2016   CREATININE 0.65 08/12/2016   Hyperlipidemia- continues atorvastatin. Denies myalgia.   Lab Results  Component Value Date   CHOL 158 08/12/2016   HDL 36.50 (L) 08/12/2016   LDLCALC 108 (H) 08/12/2016   TRIG 63.0 08/12/2016   CHOLHDL 4 08/12/2016   HTN- on losartan 25mg  once daily. Report at home max SBP was 139/80's. BP Readings from Last 3 Encounters:  12/09/16 (!) 146/85  08/12/16 125/87  05/08/16 135/89     Review of Systems See HPI  Past Medical History:  Diagnosis Date  . Arthritis   . Cancer ([place]) 12/12/2000   breast  . Diabetes ([place])   . Environmental allergies   . GERD (gastroesophageal reflux disease)   . Hyperlipidemia   . Hypertension   . Neuropathy    fingers and toes  . Osteoporosis      Social History   Social History  . Marital status: Married    Spouse name: N/A  . Number of children: N/A  . Years of education: N/A   Occupational History  . Not on file.   Social History Main Topics  . Smoking status: Never Smoker  . Smokeless tobacco: Never Used  . Alcohol use 0.0 oz/week     Comment: rarely drinks wine  . Drug use: Unknown  . Sexual activity: Not on file   Other Topics Concern  . 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 APPENDECTOMY  1982    . BREAST LUMPECTOMY Right 12/12/00   lumpectomy / axillary node dissection, sentinel node bx (cancer)  . GANGLION CYST EXCISION Right 1970   wrist  . LAPAROSCOPIC SALPINGO OOPHERECTOMY  1984   benign tumor    Family History  Problem Relation Age of Onset  . Hypertension Mother   . Stroke Mother   . Cancer Mother 57    uterine  . Heart disease Mother     CHF  . Lung disease Mother     interstital lung fibrosis  . Diabetes Father   . Lymphoma Father   . Colon cancer Paternal Grandfather 74  . Hypertension Brother   . Diabetes type II Brother   . Hypertension Sister     2 sisters  . Diabetes Mellitus II Sister     2 sisters    Allergies  Allergen Reactions  . Oxycodone-Acetaminophen     hallucinations    Current Outpatient Prescriptions on File Prior to Visit  Medication Sig Dispense Refill  . aspirin EC 81 MG tablet Take 1 tablet (81 mg total) by mouth daily.    Marland Kitchen atorvastatin (LIPITOR) 40 MG tablet Take 1 tablet (40 mg total)  by mouth daily. 90 tablet 1  . CALCIUM-MAGNESIUM-VITAMIN D PO Take 1 tablet by mouth daily.    . Cholecalciferol (VITAMIN D3) 5000 UNIT/ML LIQD Take by mouth once a week.    . docusate sodium (COLACE) 100 MG capsule Take 100 mg by mouth 2 (two) times daily.    . Glucosamine HCl 1500 MG TABS Take 1 tablet by mouth daily.    Marland Kitchen glucose blood (ONETOUCH VERIO) test strip Use as instructed to check blood sugar once a day. DX E11.40 100 each 1  . loratadine (CLARITIN) 10 MG tablet Take 10 mg by mouth daily.    Marland Kitchen losartan (COZAAR) 25 MG tablet Take 1 tablet (25 mg total) by mouth daily. 90 tablet 1  . metFORMIN (GLUCOPHAGE) 500 MG tablet TAKE 2 TABLETS BY MOUTH TWICE A DAY WITH A MEAL 360 tablet 1  . naproxen sodium (ALEVE) 220 MG tablet Take 220 mg by mouth 2 (two) times daily with a meal.    . Omega-3 Fatty Acids (OMEGA 3 PO) Take 1 capsule by mouth daily.    Marland Kitchen omeprazole (PRILOSEC) 40 MG capsule Take 1 capsule (40 mg total) by mouth daily. 90 capsule  1  . OVER THE COUNTER MEDICATION Take 500 mg by mouth daily. VITAMIN C /  ROSEHIPS    . vitamin E 400 UNIT capsule Take 400 Units by mouth daily.     No current facility-administered medications on file prior to visit.     BP (!) 146/85 (BP Location: Left Arm, Cuff Size: Large)   Temp 98.4 F (36.9 C) (Oral)   Resp 16   Ht 5\' 5"  (1.651 m)   Wt 201 lb 9.6 oz (91.4 kg)   SpO2 99% Comment: room air  BMI 33.55 kg/m       Objective:   Physical Exam  Constitutional: She is oriented to person, place, and time. She appears well-developed and well-nourished.  HENT:  Head: Normocephalic and atraumatic.  Cardiovascular: Normal rate, regular rhythm and normal heart sounds.   No murmur heard. Pulmonary/Chest: Effort normal and breath sounds normal. No respiratory distress. She has no wheezes.  Neurological: She is alert and oriented to person, place, and time.  Psychiatric: She has a normal mood and affect. Her behavior is normal. Judgment and thought content normal.          Assessment & Plan:

## 2016-12-09 NOTE — Assessment & Plan Note (Signed)
Tolerating atorvastatin.  Obtain follow up Lipid panel.

## 2016-12-09 NOTE — Progress Notes (Signed)
Pre visit review using our clinic review tool, if applicable. No additional management support is needed unless otherwise documented below in the visit note. 

## 2016-12-09 NOTE — Assessment & Plan Note (Signed)
Stable on metformin. Continue same. Obtain A1C.

## 2016-12-09 NOTE — Assessment & Plan Note (Signed)
Up a bit today. Pt attributes to stress because she is currently without power and her mother is on continuous oxygen. Continue losartan. She will continue to monitor at home.

## 2016-12-09 NOTE — Patient Instructions (Signed)
Please complete lab work prior to leaving.   

## 2017-04-11 ENCOUNTER — Other Ambulatory Visit: Payer: Self-pay | Admitting: Family

## 2017-05-05 ENCOUNTER — Ambulatory Visit (INDEPENDENT_AMBULATORY_CARE_PROVIDER_SITE_OTHER): Payer: Medicare Other | Admitting: Family

## 2017-05-05 ENCOUNTER — Encounter: Payer: Self-pay | Admitting: Family

## 2017-05-05 VITALS — BP 119/79 | HR 77 | Temp 98.7°F | Resp 16 | Ht 65.0 in | Wt 199.0 lb

## 2017-05-05 DIAGNOSIS — I10 Essential (primary) hypertension: Secondary | ICD-10-CM | POA: Diagnosis not present

## 2017-05-05 DIAGNOSIS — Z23 Encounter for immunization: Secondary | ICD-10-CM | POA: Diagnosis not present

## 2017-05-05 DIAGNOSIS — E119 Type 2 diabetes mellitus without complications: Secondary | ICD-10-CM | POA: Diagnosis not present

## 2017-05-05 DIAGNOSIS — K219 Gastro-esophageal reflux disease without esophagitis: Secondary | ICD-10-CM | POA: Diagnosis not present

## 2017-05-05 DIAGNOSIS — J309 Allergic rhinitis, unspecified: Secondary | ICD-10-CM | POA: Diagnosis not present

## 2017-05-05 LAB — BASIC METABOLIC PANEL
BUN: 13 mg/dL (ref 6–23)
CHLORIDE: 102 meq/L (ref 96–112)
CO2: 30 meq/L (ref 19–32)
Calcium: 9.8 mg/dL (ref 8.4–10.5)
Creatinine, Ser: 0.74 mg/dL (ref 0.40–1.20)
GFR: 100.86 mL/min (ref >60.00)
Glucose, Bld: 96 mg/dL (ref 70–99)
Potassium: 4.2 mEq/L (ref 3.5–5.1)
Sodium: 140 mEq/L (ref 135–145)

## 2017-05-05 LAB — HEMOGLOBIN A1C: Hgb A1c MFr Bld: 6.3 % (ref 4.6–6.5)

## 2017-05-05 NOTE — Assessment & Plan Note (Signed)
Clinically stable. Continue metformin, obtain follow up A1C. She will schedule eye exam.  Flu shot given today.

## 2017-05-05 NOTE — Patient Instructions (Signed)
Please complete lab work prior to leaving.   

## 2017-05-05 NOTE — Assessment & Plan Note (Signed)
Stable on daily claritin, continue same.

## 2017-05-05 NOTE — Assessment & Plan Note (Signed)
BP stable, continue cozaar.

## 2017-05-05 NOTE — Assessment & Plan Note (Signed)
Stable on PPI, continue same.  

## 2017-05-05 NOTE — Progress Notes (Signed)
Subjective:    Patient ID: Edward Qualia, female    DOB: 1951-08-15, 66 y.o.   MRN: [id number]  HPI  Ms. Dozier is a 66 yr old female who presents today for follow up.  1) DM2- on metformin.  Eye exam is due.  She will schedule apt.  Lab Results  Component Value Date   HGBA1C 6.5 12/09/2016   HGBA1C 6.4 08/12/2016   HGBA1C 6.3 05/08/2016   Lab Results  Component Value Date   MICROALBUR <0.7 10/16/2015   LDLCALC 90 12/09/2016   CREATININE 0.74 12/09/2016   2) HTN- maintained on cozaar.  BP Readings from Last 3 Encounters:  05/05/17 119/79  12/09/16 (!) 146/85  08/12/16 125/87   3) Allergic rhinitis- stable on claritin.  4) GERD- reports that she continues prilosec. Reports that she cannot tolerate coming off.     Review of Systems    see  HPI  Past Medical History:  Diagnosis Date  . Arthritis   . Cancer ([place]) 12/12/2000   breast  . Diabetes ([place])   . Environmental allergies   . GERD (gastroesophageal reflux disease)   . Hyperlipidemia   . Hypertension   . Neuropathy    fingers and toes  . Osteoporosis      Social History   Social History  . Marital status: Married    Spouse name: N/A  . Number of children: N/A  . Years of education: N/A   Occupational History  . Not on file.   Social History Main Topics  . Smoking status: Never Smoker  . Smokeless tobacco: Never Used  . Alcohol use 0.0 oz/week     Comment: rarely drinks wine  . Drug use: Unknown  . Sexual activity: Not on file   Other Topics Concern  . 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 APPENDECTOMY  1982  . BREAST LUMPECTOMY Right 12/12/00   lumpectomy / axillary node dissection, sentinel node bx (cancer)  . GANGLION CYST EXCISION Right 1970   wrist  .  LAPAROSCOPIC SALPINGO OOPHERECTOMY  1984   benign tumor    Family History  Problem Relation Age of Onset  . Hypertension Mother   . Stroke Mother   . Cancer Mother 50       uterine  . Heart disease Mother        CHF  . Lung disease Mother        interstital lung fibrosis  . Diabetes Father   . Lymphoma Father   . Colon cancer Paternal Grandfather 45  . Hypertension Brother   . Diabetes type II Brother   . Hypertension Sister        2 sisters  . Diabetes Mellitus II Sister        2 sisters    Allergies  Allergen Reactions  . Oxycodone-Acetaminophen     hallucinations    Current Outpatient Prescriptions on File Prior to Visit  Medication Sig Dispense Refill  . aspirin EC 81 MG tablet Take 1 tablet (81 mg total) by mouth daily.    Marland Kitchen atorvastatin (LIPITOR) 40 MG tablet Take 1 tablet (40 mg total) by mouth daily. 90 tablet 1  . CALCIUM-MAGNESIUM-VITAMIN D PO Take  1 tablet by mouth daily.    . Cholecalciferol (VITAMIN D3) 5000 UNIT/ML LIQD Take by mouth once a week.    . docusate sodium (COLACE) 100 MG capsule Take 100 mg by mouth 2 (two) times daily.    . Glucosamine HCl 1500 MG TABS Take 1 tablet by mouth daily.    Marland Kitchen glucose blood (ONETOUCH VERIO) test strip Use as instructed to check blood sugar once a day. DX E11.40 100 each 1  . loratadine (CLARITIN) 10 MG tablet Take 10 mg by mouth daily.    Marland Kitchen losartan (COZAAR) 25 MG tablet TAKE 1 TABLET DAILY 90 tablet 1  . metFORMIN (GLUCOPHAGE) 500 MG tablet TAKE 2 TABLETS BY MOUTH TWICE A DAY WITH A MEAL 360 tablet 1  . naproxen sodium (ALEVE) 220 MG tablet Take 220 mg by mouth 2 (two) times daily with a meal.    . Omega-3 Fatty Acids (OMEGA 3 PO) Take 1 capsule by mouth daily.    Marland Kitchen omeprazole (PRILOSEC) 40 MG capsule Take 1 capsule (40 mg total) by mouth daily. 90 capsule 1  . OVER THE COUNTER MEDICATION Take 500 mg by mouth daily. VITAMIN C /  ROSEHIPS    . vitamin E 400 UNIT capsule Take 400 Units by mouth daily.     No current  facility-administered medications on file prior to visit.     BP 119/79 (BP Location: Left Arm, Cuff Size: Large)   Pulse 77   Temp 98.7 F (37.1 C) (Oral)   Resp 16   Ht 5\' 5"  (1.651 m)   Wt 199 lb (90.3 kg)   SpO2 99%   BMI 33.12 kg/m     Objective:   Physical Exam  Constitutional: She is oriented to person, place, and time. She appears well-developed and well-nourished.  Cardiovascular: Normal rate, regular rhythm and normal heart sounds.   No murmur heard. Pulmonary/Chest: Effort normal and breath sounds normal. No respiratory distress. She has no wheezes.  Musculoskeletal: She exhibits no edema.  Neurological: She is alert and oriented to person, place, and time.  Skin: Skin is warm and dry.  Psychiatric: She has a normal mood and affect. Her behavior is normal. Judgment and thought content normal.          Assessment & Plan:

## 2017-06-28 ENCOUNTER — Other Ambulatory Visit: Payer: Self-pay | Admitting: Family

## 2017-08-04 ENCOUNTER — Ambulatory Visit: Payer: Medicare Other | Admitting: Family

## 2017-08-08 ENCOUNTER — Ambulatory Visit: Payer: Medicare Other | Admitting: Family

## 2017-08-08 ENCOUNTER — Ambulatory Visit (INDEPENDENT_AMBULATORY_CARE_PROVIDER_SITE_OTHER): Payer: Medicare Other | Admitting: Family

## 2017-08-08 ENCOUNTER — Encounter: Payer: Self-pay | Admitting: Family

## 2017-08-08 VITALS — BP 129/82 | HR 80 | Temp 98.5°F | Resp 16 | Ht 65.0 in | Wt 195.0 lb

## 2017-08-08 DIAGNOSIS — E785 Hyperlipidemia, unspecified: Secondary | ICD-10-CM | POA: Diagnosis not present

## 2017-08-08 DIAGNOSIS — I10 Essential (primary) hypertension: Secondary | ICD-10-CM | POA: Diagnosis not present

## 2017-08-08 DIAGNOSIS — E119 Type 2 diabetes mellitus without complications: Secondary | ICD-10-CM

## 2017-08-08 LAB — LIPID PANEL
CHOLESTEROL: 111 mg/dL (ref 0–200)
HDL: 45.4 mg/dL (ref >39.00)
LDL CALC: 53 mg/dL (ref 0–99)
NONHDL: 65.9
Total CHOL/HDL Ratio: 2
Triglycerides: 65 mg/dL (ref 0.0–149.0)
VLDL: 13 mg/dL (ref 0.0–40.0)

## 2017-08-08 LAB — HEMOGLOBIN A1C: HEMOGLOBIN A1C: 6.3 % (ref 4.6–6.5)

## 2017-08-08 MED ORDER — LOSARTAN POTASSIUM 25 MG PO TABS
25.0000 mg | ORAL_TABLET | Freq: Every day | ORAL | 1 refills | Status: DC
Start: 2017-08-08 — End: 2017-08-08

## 2017-08-08 MED ORDER — ATORVASTATIN CALCIUM 40 MG PO TABS: 40.0000 mg | ORAL_TABLET | Freq: Every day | ORAL | 0 refills | Status: DC

## 2017-08-08 MED ORDER — METFORMIN HCL 500 MG PO TABS: ORAL_TABLET | ORAL | 0 refills | Status: DC

## 2017-08-08 MED ORDER — LOSARTAN POTASSIUM 25 MG PO TABS: 25.0000 mg | ORAL_TABLET | Freq: Every day | ORAL | 1 refills | Status: DC

## 2017-08-08 MED ORDER — OMEPRAZOLE 40 MG PO CPDR: 40.0000 mg | DELAYED_RELEASE_CAPSULE | Freq: Every day | ORAL | 1 refills | Status: DC

## 2017-08-08 MED ORDER — OMEPRAZOLE 40 MG PO CPDR: 40.0000 mg | DELAYED_RELEASE_CAPSULE | Freq: Every day | ORAL | 0 refills | Status: DC

## 2017-08-08 MED ORDER — ATORVASTATIN CALCIUM 40 MG PO TABS: 40.0000 mg | ORAL_TABLET | Freq: Every day | ORAL | 1 refills | Status: DC

## 2017-08-08 MED ORDER — METFORMIN HCL 500 MG PO TABS: ORAL_TABLET | ORAL | 1 refills | Status: DC

## 2017-08-08 NOTE — Progress Notes (Signed)
Subjective:    Patient ID: Elizabeth Vang, female    DOB: February 18, 1951, 66 y.o.   MRN: [id number]  HPI  Elizabeth Vang is a 66 yr old female who presents today for follow up.  1) DM2- continues metformin, reports good diet.   Lab Results  Component Value Date   HGBA1C 6.3 05/05/2017   HGBA1C 6.5 12/09/2016   HGBA1C 6.4 08/12/2016   Lab Results  Component Value Date   MICROALBUR <0.7 10/16/2015   LDLCALC 90 12/09/2016   CREATININE 0.74 05/05/2017    2) Hypertension- denies CP/SOB or swelling.  BP Readings from Last 3 Encounters:  08/08/17 129/82  05/05/17 119/79  12/09/16 (!) 146/85   GERD- reports stable on omeprazole.     Review of Systems See HPI  Past Medical History:  Diagnosis Date  . Arthritis   . Cancer ([place]) 12/12/2000   breast  . Diabetes ([place])   . Environmental allergies   . GERD (gastroesophageal reflux disease)   . Hyperlipidemia   . Hypertension   . Neuropathy    fingers and toes  . Osteoporosis      Social History   Socioeconomic History  . Marital status: Married    Spouse name: Not on file  . Number of children: Not on file  . Years of education: Not on file  . Highest education level: Not on file  Social Needs  . Financial resource strain: Not on file  . Food insecurity - worry: Not on file  . Food insecurity - inability: Not on file  . Transportation needs - medical: Not on file  . Transportation needs - non-medical: Not on file  Occupational History  . Not on file  Tobacco Use  . Smoking status: Never Smoker  . Smokeless tobacco: Never Used  Substance and Sexual Activity  . Alcohol use: Yes    Alcohol/week: 0.0 oz    Comment: rarely drinks wine  . Drug use: Not on file  . Sexual activity: Not on file  Other Topics Concern  . 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 APPENDECTOMY  1982  . BREAST LUMPECTOMY Right 12/12/00   lumpectomy / axillary node dissection, sentinel node bx (cancer)  . GANGLION CYST EXCISION Right 1970   wrist  . LAPAROSCOPIC SALPINGO OOPHERECTOMY  1984   benign tumor    Family History  Problem Relation Age of Onset  . Hypertension Mother   . Stroke Mother   . Cancer Mother 37       uterine  . Heart disease Mother        CHF  . Lung disease Mother        interstital lung fibrosis  . Diabetes Father   . Lymphoma Father   . Colon cancer Paternal Grandfather 35  . Hypertension Brother   . Diabetes type II Brother   . Hypertension Sister        2 sisters  . Diabetes Mellitus II Sister        2 sisters    Allergies  Allergen Reactions  . Oxycodone-Acetaminophen     hallucinations    Current Outpatient Medications on File Prior to Visit  Medication Sig Dispense Refill  . aspirin  EC 81 MG tablet Take 1 tablet (81 mg total) by mouth daily.    Marland Kitchen atorvastatin (LIPITOR) 40 MG tablet TAKE 1 TABLET DAILY 90 tablet 1  . CALCIUM-MAGNESIUM-VITAMIN D PO Take 1 tablet by mouth daily.    . Cholecalciferol (VITAMIN D3) 5000 UNIT/ML LIQD Take by mouth once a week.    . docusate sodium (COLACE) 100 MG capsule Take 100 mg by mouth 2 (two) times daily.    . Glucosamine HCl 1500 MG TABS Take 1 tablet by mouth daily.    Marland Kitchen glucose blood (ONETOUCH VERIO) test strip Use as instructed to check blood sugar once a day. DX E11.40 100 each 1  . loratadine (CLARITIN) 10 MG tablet Take 10 mg by mouth daily.    Marland Kitchen losartan (COZAAR) 25 MG tablet TAKE 1 TABLET DAILY 90 tablet 1  . metFORMIN (GLUCOPHAGE) 500 MG tablet TAKE 2 TABLETS 2 TIMES     DAILY WITH MEALS 360 tablet 1  . naproxen sodium (ALEVE) 220 MG tablet Take 220 mg by mouth 2 (two) times daily with a meal.    . Omega-3 Fatty Acids (OMEGA 3 PO) Take 1 capsule by mouth daily.    Marland Kitchen omeprazole (PRILOSEC) 40 MG capsule TAKE 1  CAPSULE DAILY 90 capsule 1  . OVER THE COUNTER MEDICATION Take 500 mg by mouth daily. VITAMIN C /  ROSEHIPS    . vitamin E 400 UNIT capsule Take 400 Units by mouth daily.     No current facility-administered medications on file prior to visit.     BP 129/82 (BP Location: Left Arm, Patient Position: Sitting, Cuff Size: Large)   Pulse 80   Temp 98.5 F (36.9 C) (Oral)   Resp 16   Ht 5\' 5"  (1.651 m)   Wt 195 lb (88.5 kg)   SpO2 100%   BMI 32.45 kg/m       Objective:   Physical Exam  Constitutional: She is oriented to person, place, and time. She appears well-developed and well-nourished.  Cardiovascular: Normal rate, regular rhythm and normal heart sounds.  No murmur heard. Pulmonary/Chest: Effort normal and breath sounds normal. No respiratory distress. She has no wheezes.  Musculoskeletal: She exhibits no edema.  Neurological: She is alert and oriented to person, place, and time.  Psychiatric: She has a normal mood and affect. Her behavior is normal. Judgment and thought content normal.          Assessment & Plan:  Offered Pneumovax booster to her today-she declines.  Hypertension-blood pressure is stable on current medications continue same.  Diabetes type 2-stable continue metformin.  Continue diabetic diet.  Obtain follow-up A1c.  GERD-stable on PPI continue same.

## 2017-08-08 NOTE — Patient Instructions (Signed)
Please complete lab work prior to leaving.   

## 2017-08-09 LAB — COMPREHENSIVE METABOLIC PANEL
AG RATIO: 1.4 (calc) (ref 1.0–2.5)
ALBUMIN MSPROF: 4.1 g/dL (ref 3.6–5.1)
ALKALINE PHOSPHATASE (APISO): 47 U/L (ref 33–130)
ALT: 21 U/L (ref 6–29)
AST: 18 U/L (ref 10–35)
BUN: 8 mg/dL (ref 7–25)
CHLORIDE: 102 mmol/L (ref 98–110)
CO2: 31 mmol/L (ref 20–32)
CREATININE: 0.68 mg/dL (ref 0.50–0.99)
Calcium: 9.1 mg/dL (ref 8.6–10.4)
GLOBULIN: 2.9 g/dL (ref 1.9–3.7)
Glucose, Bld: 93 mg/dL (ref 65–99)
POTASSIUM: 4.2 mmol/L (ref 3.5–5.3)
Sodium: 142 mmol/L (ref 135–146)
Total Bilirubin: 0.4 mg/dL (ref 0.2–1.2)
Total Protein: 7 g/dL (ref 6.1–8.1)

## 2017-08-11 ENCOUNTER — Ambulatory Visit: Payer: Medicare Other | Admitting: Family

## 2017-10-10 NOTE — Progress Notes (Signed)
Closed

## 2017-12-16 ENCOUNTER — Encounter: Payer: Self-pay | Admitting: Family

## 2017-12-16 ENCOUNTER — Ambulatory Visit (INDEPENDENT_AMBULATORY_CARE_PROVIDER_SITE_OTHER): Payer: Medicare Other | Admitting: Family

## 2017-12-16 VITALS — BP 131/75 | HR 73 | Temp 98.6°F | Resp 16 | Ht 65.0 in | Wt 194.2 lb

## 2017-12-16 DIAGNOSIS — E785 Hyperlipidemia, unspecified: Secondary | ICD-10-CM | POA: Diagnosis not present

## 2017-12-16 DIAGNOSIS — I10 Essential (primary) hypertension: Secondary | ICD-10-CM

## 2017-12-16 DIAGNOSIS — E119 Type 2 diabetes mellitus without complications: Secondary | ICD-10-CM | POA: Diagnosis not present

## 2017-12-16 LAB — MICROALBUMIN / CREATININE URINE RATIO
Creatinine,U: 37.6 mg/dL
MICROALB UR: 0.2 mg/dL (ref 0.0–1.9)
MICROALB/CREAT RATIO: 0.5 mg/g (ref 0.0–30.0)

## 2017-12-16 LAB — BASIC METABOLIC PANEL
BUN: 9 mg/dL (ref 6–23)
CALCIUM: 9.6 mg/dL (ref 8.4–10.5)
CO2: 30 mEq/L (ref 19–32)
CREATININE: 0.61 mg/dL (ref 0.40–1.20)
Chloride: 101 mEq/L (ref 96–112)
GFR: 125.81 mL/min (ref >60.00)
Glucose, Bld: 88 mg/dL (ref 70–99)
Potassium: 3.5 mEq/L (ref 3.5–5.1)
Sodium: 139 mEq/L (ref 135–145)

## 2017-12-16 LAB — HEMOGLOBIN A1C: HEMOGLOBIN A1C: 6.3 % (ref 4.6–6.5)

## 2017-12-16 MED ORDER — ATORVASTATIN CALCIUM 40 MG PO TABS: 40.0000 mg | ORAL_TABLET | Freq: Every day | ORAL | 1 refills | Status: DC

## 2017-12-16 MED ORDER — LOSARTAN POTASSIUM 25 MG PO TABS: 25.0000 mg | ORAL_TABLET | Freq: Every day | ORAL | 1 refills | Status: DC

## 2017-12-16 MED ORDER — METFORMIN HCL 500 MG PO TABS: ORAL_TABLET | ORAL | 1 refills | Status: DC

## 2017-12-16 MED ORDER — OMEPRAZOLE 40 MG PO CPDR: 40.0000 mg | DELAYED_RELEASE_CAPSULE | Freq: Every day | ORAL | 1 refills | Status: DC

## 2017-12-16 NOTE — Progress Notes (Signed)
Subjective:    Patient ID: Edward Qualia, female    DOB: September 02, 1950, 67 y.o.   MRN: [id number]  HPI   Ms. Muckle is a 67 yr old female who presents today for follow up.  DM2- reports home readings 90-109. Diet is "ok."  Not enough exercise- some.  She is caring for her mother.  Continues metformin 500mg  bid.  Lab Results  Component Value Date   HGBA1C 6.3 08/08/2017   HGBA1C 6.3 05/05/2017   HGBA1C 6.5 12/09/2016   Lab Results  Component Value Date   MICROALBUR <0.7 10/16/2015   LDLCALC 53 08/08/2017   CREATININE 0.68 08/08/2017   Hyperlipidemia- continues lipitor. Denies myalgia. Lab Results  Component Value Date   CHOL 111 08/08/2017   HDL 45.40 08/08/2017   LDLCALC 53 08/08/2017   TRIG 65.0 08/08/2017   CHOLHDL 2 08/08/2017    HTN- on losartan.  BP Readings from Last 3 Encounters:  12/16/17 131/75  08/08/17 129/82  05/05/17 119/79      Review of Systems   See HPI  Past Medical History:  Diagnosis Date  . Arthritis   . Cancer ([place]) 12/12/2000   breast  . Diabetes ([place])   . Environmental allergies   . GERD (gastroesophageal reflux disease)   . Hyperlipidemia   . Hypertension   . Neuropathy    fingers and toes  . Osteoporosis      Social History   Socioeconomic History  . Marital status: Married    Spouse name: Not on file  . Number of children: Not on file  . Years of education: Not on file  . Highest education level: Not on file  Occupational History  . Not on file  Social Needs  . Financial resource strain: Not on file  . Food insecurity:    Worry: Not on file    Inability: Not on file  . Transportation needs:    Medical: Not on file    Non-medical: Not on file  Tobacco Use  . Smoking status: Never Smoker  . Smokeless tobacco: Never Used  Substance and Sexual Activity  . Alcohol use: Yes    Alcohol/week: 0.0 oz    Comment: rarely drinks wine  . Drug use: Not on file  . Sexual activity: Not on file  Lifestyle  .  Physical activity:    Days per week: Not on file    Minutes per session: Not on file  . Stress: Not on file  Relationships  . Social connections:    Talks on phone: Not on file    Gets together: Not on file    Attends religious service: Not on file    Active member of club or organization: Not on file    Attends meetings of clubs or organizations: Not on file    Relationship status: Not on file  . Intimate partner violence:    Fear of current or ex partner: Not on file    Emotionally abused: Not on file    Physically abused: Not on file    Forced sexual activity: Not on file  Other Topics Concern  . 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 APPENDECTOMY  1982  . BREAST LUMPECTOMY Right 12/12/00  lumpectomy / axillary node dissection, sentinel node bx (cancer)  . GANGLION CYST EXCISION Right 1970   wrist  . LAPAROSCOPIC SALPINGO OOPHERECTOMY  1984   benign tumor    Family History  Problem Relation Age of Onset  . Hypertension Mother   . Stroke Mother   . Cancer Mother 50       uterine  . Heart disease Mother        CHF  . Lung disease Mother        interstital lung fibrosis  . Diabetes Father   . Lymphoma Father   . Colon cancer Paternal Grandfather 15  . Hypertension Brother   . Diabetes type II Brother   . Hypertension Sister        2 sisters  . Diabetes Mellitus II Sister        2 sisters    Allergies  Allergen Reactions  . Oxycodone-Acetaminophen     hallucinations    Current Outpatient Medications on File Prior to Visit  Medication Sig Dispense Refill  . aspirin EC 81 MG tablet Take 1 tablet (81 mg total) by mouth daily.    Marland Kitchen atorvastatin (LIPITOR) 40 MG tablet Take 1 tablet (40 mg total) by mouth daily. 90 tablet 1  .  CALCIUM-MAGNESIUM-VITAMIN D PO Take 1 tablet by mouth daily.    . Cholecalciferol (VITAMIN D3) 5000 UNIT/ML LIQD Take by mouth once a week.    . docusate sodium (COLACE) 100 MG capsule Take 100 mg by mouth 2 (two) times daily.    . Glucosamine HCl 1500 MG TABS Take 1 tablet by mouth daily.    Marland Kitchen glucose blood (ONETOUCH VERIO) test strip Use as instructed to check blood sugar once a day. DX E11.40 100 each 1  . loratadine (CLARITIN) 10 MG tablet Take 10 mg by mouth daily.    Marland Kitchen losartan (COZAAR) 25 MG tablet Take 1 tablet (25 mg total) by mouth daily. 90 tablet 1  . metFORMIN (GLUCOPHAGE) 500 MG tablet TAKE 2 TABLETS 2 TIMES     DAILY WITH MEALS 360 tablet 1  . naproxen sodium (ALEVE) 220 MG tablet Take 220 mg by mouth 2 (two) times daily with a meal.    . Omega-3 Fatty Acids (OMEGA 3 PO) Take 1 capsule by mouth daily.    Marland Kitchen omeprazole (PRILOSEC) 40 MG capsule Take 1 capsule (40 mg total) by mouth daily. 90 capsule 1  . OVER THE COUNTER MEDICATION Take 500 mg by mouth daily. VITAMIN C /  ROSEHIPS    . vitamin E 400 UNIT capsule Take 400 Units by mouth daily.     No current facility-administered medications on file prior to visit.     BP 131/75 (BP Location: Left Arm, Patient Position: Sitting, Cuff Size: Large)   Pulse 73   Temp 98.6 F (37 C) (Oral)   Resp 16   Ht 5\' 5"  (1.651 m)   Wt 194 lb 3.2 oz (88.1 kg)   SpO2 99%   BMI 32.32 kg/m        Objective:   Physical Exam  Constitutional: She appears well-developed and well-nourished.  HENT:  Head: Normocephalic and atraumatic.  Cardiovascular: Normal rate, regular rhythm and normal heart sounds.  No murmur heard. Pulmonary/Chest: Effort normal and breath sounds normal. No respiratory distress. She has no wheezes.  Musculoskeletal: She exhibits no edema.  Skin: Skin is warm and dry.  Psychiatric: She has a normal mood and affect. Her behavior is normal. Judgment and  thought content normal.          Assessment & Plan:  HTN-  BP stable, continue lisinopril.  DM2- clinically stable on metformin, declines pneumovax booster and vision referral, she plans to schedule eye exam.  Declines hep c screening.   Hyperlipidemia- tolerating statin, ldl at goal, continue same.

## 2017-12-16 NOTE — Patient Instructions (Signed)
Please complete lab work prior to leaving.   

## 2018-04-24 ENCOUNTER — Telehealth: Payer: Self-pay | Admitting: Family

## 2018-04-24 NOTE — Telephone Encounter (Signed)
Attempted to call patient regarding her refill of atorvastatin 40 mg and metformin 500 mg tabs. No answer, left message that [facility] was contacted and he medications should be coming in the mail.  PCP  [name], NP LOV  12/16/17 NOV  04/28/18 Last HA1C was 12/16/17 Last refill of Metformin was 12/16/17 for 360 tabs and 1 refill. And the atorvastatin 40 mg was 12/16/17 for 90 tabs and 1 refill

## 2018-04-24 NOTE — Telephone Encounter (Signed)
Copied from [phone] [phone]. Topic: Quick Communication - Rx Refill/Question >> Apr 24, 2018 12:53 PM Bea Graff, NT wrote: Medication: atorvastatin (LIPITOR) 40 MG tablet and metFORMIN (GLUCOPHAGE) 500 MG tablet  Has the patient contacted their pharmacy? Yes.   (Agent: If no, request that the patient contact the pharmacy for the refill.) (Agent: If yes, when and what did the pharmacy advise?)  Preferred Pharmacy (with phone number or street name): [facility], [place] to [facility] [phone] (Phone) [phone] (Fax)    Agent: Please be advised that RX refills may take up to 3 business days. We ask that you follow-up with your pharmacy.

## 2018-04-28 ENCOUNTER — Ambulatory Visit (INDEPENDENT_AMBULATORY_CARE_PROVIDER_SITE_OTHER): Payer: Medicare Other | Admitting: Family

## 2018-04-28 ENCOUNTER — Encounter: Payer: Self-pay | Admitting: Family

## 2018-04-28 VITALS — BP 140/89 | HR 76 | Temp 98.6°F | Resp 16 | Ht 65.0 in | Wt 195.6 lb

## 2018-04-28 DIAGNOSIS — I10 Essential (primary) hypertension: Secondary | ICD-10-CM | POA: Diagnosis not present

## 2018-04-28 DIAGNOSIS — K219 Gastro-esophageal reflux disease without esophagitis: Secondary | ICD-10-CM | POA: Diagnosis not present

## 2018-04-28 DIAGNOSIS — E119 Type 2 diabetes mellitus without complications: Secondary | ICD-10-CM | POA: Diagnosis not present

## 2018-04-28 DIAGNOSIS — E785 Hyperlipidemia, unspecified: Secondary | ICD-10-CM | POA: Diagnosis not present

## 2018-04-28 DIAGNOSIS — E559 Vitamin D deficiency, unspecified: Secondary | ICD-10-CM | POA: Diagnosis not present

## 2018-04-28 LAB — BASIC METABOLIC PANEL
BUN: 10 mg/dL (ref 6–23)
CALCIUM: 9.3 mg/dL (ref 8.4–10.5)
CO2: 27 meq/L (ref 19–32)
Chloride: 101 mEq/L (ref 96–112)
Creatinine, Ser: 0.72 mg/dL (ref 0.40–1.20)
GFR: 103.79 mL/min (ref >60.00)
Glucose, Bld: 86 mg/dL (ref 70–99)
Potassium: 3.9 mEq/L (ref 3.5–5.1)
SODIUM: 139 meq/L (ref 135–145)

## 2018-04-28 LAB — VITAMIN D 25 HYDROXY (VIT D DEFICIENCY, FRACTURES): VITD: 34.15 ng/mL (ref 30.00–100.00)

## 2018-04-28 LAB — HEMOGLOBIN A1C: Hgb A1c MFr Bld: 6.4 % (ref 4.6–6.5)

## 2018-04-28 MED ORDER — METFORMIN HCL 500 MG PO TABS: ORAL_TABLET | ORAL | 1 refills | Status: DC

## 2018-04-28 MED ORDER — ATORVASTATIN CALCIUM 40 MG PO TABS: 40.0000 mg | ORAL_TABLET | Freq: Every day | ORAL | 1 refills | Status: DC

## 2018-04-28 MED ORDER — OMEPRAZOLE 40 MG PO CPDR: 40.0000 mg | DELAYED_RELEASE_CAPSULE | Freq: Every day | ORAL | 1 refills | Status: DC

## 2018-04-28 MED ORDER — LOSARTAN POTASSIUM 25 MG PO TABS: 25.0000 mg | ORAL_TABLET | Freq: Every day | ORAL | 1 refills | Status: DC

## 2018-04-28 NOTE — Patient Instructions (Signed)
Please complete lab work prior to leaving.  Check your blood pressure weekly at home. Let me know if it is running >130/80 consistently.

## 2018-04-28 NOTE — Progress Notes (Signed)
Subjective:    Patient ID: Elizabeth Vang, female    DOB: Sep 24, 1950, 67 y.o.   MRN: [id number]  HPI  Patient is a 67 year old female who presents today for routine follow-up  DM2-she is maintained on metformin. Sugar 88-105 fasting.  Denies symptomatic hypoglycemia.  Lab Results  Component Value Date   HGBA1C 6.3 12/16/2017   HGBA1C 6.3 08/08/2017   HGBA1C 6.3 05/05/2017   Lab Results  Component Value Date   MICROALBUR 0.2 12/16/2017   LDLCALC 53 08/08/2017   CREATININE 0.61 12/16/2017   HTN-  She is maintained on losartan 25 mg by mouth daily. BP Readings from Last 3 Encounters:  04/28/18 140/89  12/16/17 131/75  08/08/17 129/82   Hyper lipidemia-continues atorvastatin 40 mg once daily. Denies myalgia.   Lab Results  Component Value Date   CHOL 111 08/08/2017   HDL 45.40 08/08/2017   LDLCALC 53 08/08/2017   TRIG 65.0 08/08/2017   CHOLHDL 2 08/08/2017   Jerrye Bushy- continues omeprazole 40mg  once daily. Takes most days. Can't tolerate not taking as she gets a "choking sensation."    Review of Systems See HPI  Past Medical History:  Diagnosis Date  . Arthritis   . Cancer ([place]) 12/12/2000   breast  . Diabetes ([place])   . Environmental allergies   . GERD (gastroesophageal reflux disease)   . Hyperlipidemia   . Hypertension   . Neuropathy    fingers and toes  . Osteoporosis      Social History   Socioeconomic History  . Marital status: Married    Spouse name: Not on file  . Number of children: Not on file  . Years of education: Not on file  . Highest education level: Not on file  Occupational History  . Not on file  Social Needs  . Financial resource strain: Not on file  . Food insecurity:    Worry: Not on file    Inability: Not on file  . Transportation needs:    Medical: Not on file    Non-medical: Not on file  Tobacco Use  . Smoking status: Never Smoker  . Smokeless tobacco: Never Used  Substance and Sexual Activity  . Alcohol use: Yes   Alcohol/week: 0.0 standard drinks    Comment: rarely drinks wine  . Drug use: Not on file  . Sexual activity: Not on file  Lifestyle  . Physical activity:    Days per week: Not on file    Minutes per session: Not on file  . Stress: Not on file  Relationships  . Social connections:    Talks on phone: Not on file    Gets together: Not on file    Attends religious service: Not on file    Active member of club or organization: Not on file    Attends meetings of clubs or organizations: Not on file    Relationship status: Not on file  . Intimate partner violence:    Fear of current or ex partner: Not on file    Emotionally abused: Not on file    Physically abused: Not on file    Forced sexual activity: Not on file  Other Topics Concern  . 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  APPENDECTOMY  1982  . BREAST LUMPECTOMY Right 12/12/00   lumpectomy / axillary node dissection, sentinel node bx (cancer)  . GANGLION CYST EXCISION Right 1970   wrist  . LAPAROSCOPIC SALPINGO OOPHERECTOMY  1984   benign tumor    Family History  Problem Relation Age of Onset  . Hypertension Mother   . Stroke Mother   . Cancer Mother 29       uterine  . Heart disease Mother        CHF  . Lung disease Mother        interstital lung fibrosis  . Diabetes Father   . Lymphoma Father   . Colon cancer Paternal Grandfather 70  . Hypertension Brother   . Diabetes type II Brother   . Hypertension Sister        2 sisters  . Diabetes Mellitus II Sister        2 sisters    Allergies  Allergen Reactions  . Oxycodone-Acetaminophen     hallucinations    Current Outpatient Medications on File Prior to Visit  Medication Sig Dispense Refill  . aspirin EC 81 MG tablet Take 1 tablet (81 mg  total) by mouth daily.    Marland Kitchen atorvastatin (LIPITOR) 40 MG tablet Take 1 tablet (40 mg total) by mouth daily. 90 tablet 1  . CALCIUM-MAGNESIUM-VITAMIN D PO Take 1 tablet by mouth daily as needed.     . Cholecalciferol (VITAMIN D3) 5000 UNIT/ML LIQD Take by mouth once a week.    . docusate sodium (COLACE) 100 MG capsule Take 100 mg by mouth 2 (two) times daily as needed.     . Glucosamine HCl 1500 MG TABS Take 1 tablet by mouth daily.    Marland Kitchen glucose blood (ONETOUCH VERIO) test strip Use as instructed to check blood sugar once a day. DX E11.40 100 each 1  . loratadine (CLARITIN) 10 MG tablet Take 10 mg by mouth daily.    Marland Kitchen losartan (COZAAR) 25 MG tablet Take 1 tablet (25 mg total) by mouth daily. 90 tablet 1  . metFORMIN (GLUCOPHAGE) 500 MG tablet TAKE 2 TABLETS 2 TIMES     DAILY WITH MEALS 360 tablet 1  . Omega-3 Fatty Acids (OMEGA 3 PO) Take 1 capsule by mouth daily.    Marland Kitchen omeprazole (PRILOSEC) 40 MG capsule Take 1 capsule (40 mg total) by mouth daily. 90 capsule 1  . OVER THE COUNTER MEDICATION Take 500 mg by mouth daily. VITAMIN C /  ROSEHIPS    . vitamin E 400 UNIT capsule Take 400 Units by mouth daily as needed.      No current facility-administered medications on file prior to visit.     BP 140/89 (BP Location: Right Arm, Cuff Size: Large)   Pulse 76   Temp 98.6 F (37 C) (Oral)   Resp 16   Ht 5\' 5"  (1.651 m)   Wt 195 lb 9.6 oz (88.7 kg)   SpO2 100%   BMI 32.55 kg/m       Objective:   Physical Exam  Constitutional: She appears well-developed and well-nourished.  Cardiovascular: Normal rate, regular rhythm and normal heart sounds.  No murmur heard. Pulmonary/Chest: Effort normal and breath sounds normal. No respiratory distress. She has no wheezes.  Musculoskeletal: She exhibits no edema.  Psychiatric: She has a normal mood and affect. Her behavior is normal. Judgment and thought content normal.          Assessment & Plan:  HTN-blood pressure is mildly  above goal today.   It has been better on recent visits.  We will continue current dose of losartan and ask the patient to check her blood pressure once weekly at home.  She has been advised to let me know if her blood pressures consistently running greater than 130/80.  Otherwise we will plan to recheck in 3 months.  GERD- discussed risks benefits of proton pump inhibitors.  She wishes to continue at this time.  Hyperlipidemia-LDL is at goal.  Vitamin D deficiency-taking 5000 units once weekly.  Diabetes type 2-clinically stable on metformin.  Continue same.  Obtain follow-up basic metabolic panel and G9F. Declines pneumovax 23 booster today and flu shot.

## 2018-07-29 ENCOUNTER — Encounter: Payer: Self-pay | Admitting: Family

## 2018-07-29 ENCOUNTER — Ambulatory Visit (INDEPENDENT_AMBULATORY_CARE_PROVIDER_SITE_OTHER): Payer: Medicare Other | Admitting: Family

## 2018-07-29 VITALS — BP 148/89 | HR 87 | Temp 98.3°F | Ht 65.0 in | Wt 200.0 lb

## 2018-07-29 DIAGNOSIS — Z1239 Encounter for other screening for malignant neoplasm of breast: Secondary | ICD-10-CM

## 2018-07-29 DIAGNOSIS — E1140 Type 2 diabetes mellitus with diabetic neuropathy, unspecified: Secondary | ICD-10-CM

## 2018-07-29 DIAGNOSIS — E785 Hyperlipidemia, unspecified: Secondary | ICD-10-CM

## 2018-07-29 DIAGNOSIS — E1165 Type 2 diabetes mellitus with hyperglycemia: Secondary | ICD-10-CM | POA: Diagnosis not present

## 2018-07-29 DIAGNOSIS — Z23 Encounter for immunization: Secondary | ICD-10-CM

## 2018-07-29 DIAGNOSIS — Z1231 Encounter for screening mammogram for malignant neoplasm of breast: Secondary | ICD-10-CM

## 2018-07-29 DIAGNOSIS — IMO0002 Reserved for concepts with insufficient information to code with codable children: Secondary | ICD-10-CM

## 2018-07-29 LAB — COMPREHENSIVE METABOLIC PANEL
ALBUMIN: 4.5 g/dL (ref 3.5–5.2)
ALT: 25 U/L (ref 0–35)
AST: 21 U/L (ref 0–37)
Alkaline Phosphatase: 52 U/L (ref 39–117)
BUN: 9 mg/dL (ref 6–23)
CALCIUM: 9.7 mg/dL (ref 8.4–10.5)
CHLORIDE: 102 meq/L (ref 96–112)
CO2: 30 meq/L (ref 19–32)
CREATININE: 0.63 mg/dL (ref 0.40–1.20)
GFR: 120.99 mL/min (ref >60.00)
Glucose, Bld: 92 mg/dL (ref 70–99)
POTASSIUM: 4.5 meq/L (ref 3.5–5.1)
Sodium: 140 mEq/L (ref 135–145)
Total Bilirubin: 0.3 mg/dL (ref 0.2–1.2)
Total Protein: 7.5 g/dL (ref 6.0–8.3)

## 2018-07-29 LAB — LIPID PANEL
Cholesterol: 128 mg/dL (ref 0–200)
HDL: 40.8 mg/dL (ref >39.00)
LDL CALC: 74 mg/dL (ref 0–99)
NonHDL: 87.49
TRIGLYCERIDES: 67 mg/dL (ref 0.0–149.0)
Total CHOL/HDL Ratio: 3
VLDL: 13.4 mg/dL (ref 0.0–40.0)

## 2018-07-29 LAB — HEMOGLOBIN A1C: HEMOGLOBIN A1C: 6.3 % (ref 4.6–6.5)

## 2018-07-29 MED ORDER — LOSARTAN POTASSIUM 50 MG PO TABS: 50.0000 mg | ORAL_TABLET | Freq: Every day | ORAL | 1 refills | Status: DC

## 2018-07-29 NOTE — Patient Instructions (Addendum)
Please increase losartan from 25mg  to 50mg .  Complete lab work prior to leaving. Schedule mammogram on the first floor.

## 2018-07-29 NOTE — Progress Notes (Signed)
Subjective:    Patient ID: Elizabeth Vang, female    DOB: 08-06-1951, 67 y.o.   MRN: [id number]  HPI  Patient is a 67 yr old female who presents today for follow up.  HTN- maintained on losartan.  Reports that her readings at home  BP Readings from Last 3 Encounters:  07/29/18 (!) 148/89  04/28/18 140/89  12/16/17 131/75   DM2-maintained on metformin/DM diet. Reports diet is good overall.  Lab Results  Component Value Date   HGBA1C 6.4 04/28/2018   HGBA1C 6.3 12/16/2017   HGBA1C 6.3 08/08/2017   Lab Results  Component Value Date   MICROALBUR 0.2 12/16/2017   LDLCALC 53 08/08/2017   CREATININE 0.72 04/28/2018   Hyperlipidemia- maintained on lipitor. Denies myalgia.  Lab Results  Component Value Date   CHOL 111 08/08/2017   HDL 45.40 08/08/2017   LDLCALC 53 08/08/2017   TRIG 65.0 08/08/2017   CHOLHDL 2 08/08/2017      Review of Systems See HPI  Past Medical History:  Diagnosis Date  . Arthritis   . Cancer ([place]) 12/12/2000   breast  . Diabetes ([place])   . Environmental allergies   . GERD (gastroesophageal reflux disease)   . Hyperlipidemia   . Hypertension   . Neuropathy    fingers and toes  . Osteoporosis      Social History   Socioeconomic History  . Marital status: Married    Spouse name: Not on file  . Number of children: Not on file  . Years of education: Not on file  . Highest education level: Not on file  Occupational History  . Not on file  Social Needs  . Financial resource strain: Not on file  . Food insecurity:    Worry: Not on file    Inability: Not on file  . Transportation needs:    Medical: Not on file    Non-medical: Not on file  Tobacco Use  . Smoking status: Never Smoker  . Smokeless tobacco: Never Used  Substance and Sexual Activity  . Alcohol use: Yes    Alcohol/week: 0.0 standard drinks    Comment: rarely drinks wine  . Drug use: Not on file  . Sexual activity: Not on file  Lifestyle  . Physical activity:   Days per week: Not on file    Minutes per session: Not on file  . Stress: Not on file  Relationships  . Social connections:    Talks on phone: Not on file    Gets together: Not on file    Attends religious service: Not on file    Active member of club or organization: Not on file    Attends meetings of clubs or organizations: Not on file    Relationship status: Not on file  . Intimate partner violence:    Fear of current or ex partner: Not on file    Emotionally abused: Not on file    Physically abused: Not on file    Forced sexual activity: Not on file  Other Topics Concern  . 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 APPENDECTOMY  1982  . BREAST LUMPECTOMY Right 12/12/00   lumpectomy / axillary node dissection, sentinel node bx (cancer)  .  GANGLION CYST EXCISION Right 1970   wrist  . LAPAROSCOPIC SALPINGO OOPHERECTOMY  1984   benign tumor    Family History  Problem Relation Age of Onset  . Hypertension Mother   . Stroke Mother   . Cancer Mother 104       uterine  . Heart disease Mother        CHF  . Lung disease Mother        interstital lung fibrosis  . Diabetes Father   . Lymphoma Father   . Colon cancer Paternal Grandfather 56  . Hypertension Brother   . Diabetes type II Brother   . Hypertension Sister        2 sisters  . Diabetes Mellitus II Sister        2 sisters    Allergies  Allergen Reactions  . Oxycodone-Acetaminophen     hallucinations    Current Outpatient Medications on File Prior to Visit  Medication Sig Dispense Refill  . aspirin EC 81 MG tablet Take 1 tablet (81 mg total) by mouth daily.    Marland Kitchen atorvastatin (LIPITOR) 40 MG tablet Take 1 tablet (40 mg total) by mouth daily. 90 tablet 1  . CALCIUM-MAGNESIUM-VITAMIN D PO Take 1  tablet by mouth daily as needed.     . Cholecalciferol (VITAMIN D3) 5000 UNIT/ML LIQD Take by mouth once a week.    . docusate sodium (COLACE) 100 MG capsule Take 100 mg by mouth 2 (two) times daily as needed.     . Glucosamine HCl 1500 MG TABS Take 1 tablet by mouth daily.    Marland Kitchen glucose blood (ONETOUCH VERIO) test strip Use as instructed to check blood sugar once a day. DX E11.40 100 each 1  . loratadine (CLARITIN) 10 MG tablet Take 10 mg by mouth daily.    . metFORMIN (GLUCOPHAGE) 500 MG tablet TAKE 2 TABLETS 2 TIMES     DAILY WITH MEALS 360 tablet 1  . Omega-3 Fatty Acids (OMEGA 3 PO) Take 1 capsule by mouth daily.    Marland Kitchen omeprazole (PRILOSEC) 40 MG capsule Take 1 capsule (40 mg total) by mouth daily. 90 capsule 1  . OVER THE COUNTER MEDICATION Take 500 mg by mouth daily. VITAMIN C /  ROSEHIPS    . vitamin E 400 UNIT capsule Take 400 Units by mouth daily as needed.      No current facility-administered medications on file prior to visit.     BP (!) 148/89 (BP Location: Left Arm, Patient Position: Sitting, Cuff Size: Large)   Pulse 87   Temp 98.3 F (36.8 C) (Oral)   Ht 5\' 5"  (1.651 m)   Wt 200 lb (90.7 kg)   SpO2 99%   BMI 33.28 kg/m       Objective:   Physical Exam  Constitutional: She is oriented to person, place, and time. She appears well-developed and well-nourished.  Neck: Neck supple. No thyromegaly present.  Cardiovascular: Normal rate, regular rhythm and normal heart sounds.  No murmur heard. Pulmonary/Chest: Effort normal and breath sounds normal. No respiratory distress. She has no wheezes.  Neurological: She is alert and oriented to person, place, and time.  Skin: Skin is warm and dry.  Psychiatric: She has a normal mood and affect. Her behavior is normal. Judgment and thought content normal.          Assessment & Plan:  DM2- clinically stable. Obtain follow up A1C, continue metformin.   HTN- BP slightly above goal. Notes home readings  have been running  higher. Will increase losartan from 25mg  to 50mg . Plan 1 month follow up.  Hyperlipidemia- tolerating statin. Obtain follow up lipid panel.

## 2018-09-07 ENCOUNTER — Ambulatory Visit (HOSPITAL_BASED_OUTPATIENT_CLINIC_OR_DEPARTMENT_OTHER)
Admission: RE | Admit: 2018-09-07 | Discharge: 2018-09-07 | Disposition: A | Discharge status: Home / Self Care | Payer: Medicare Other | Source: Ambulatory Visit | Attending: Family | Admitting: Family

## 2018-09-07 ENCOUNTER — Encounter: Payer: Self-pay | Admitting: Family

## 2018-09-07 ENCOUNTER — Ambulatory Visit (INDEPENDENT_AMBULATORY_CARE_PROVIDER_SITE_OTHER): Payer: Medicare Other | Admitting: Family

## 2018-09-07 VITALS — BP 128/76 | HR 82 | Temp 98.9°F | Resp 16 | Ht 65.0 in | Wt 201.0 lb

## 2018-09-07 DIAGNOSIS — Z1239 Encounter for other screening for malignant neoplasm of breast: Secondary | ICD-10-CM | POA: Diagnosis present

## 2018-09-07 DIAGNOSIS — I10 Essential (primary) hypertension: Secondary | ICD-10-CM

## 2018-09-07 DIAGNOSIS — E1140 Type 2 diabetes mellitus with diabetic neuropathy, unspecified: Secondary | ICD-10-CM | POA: Diagnosis not present

## 2018-09-07 DIAGNOSIS — Z1231 Encounter for screening mammogram for malignant neoplasm of breast: Secondary | ICD-10-CM | POA: Diagnosis not present

## 2018-09-07 LAB — BASIC METABOLIC PANEL
BUN: 10 mg/dL (ref 6–23)
CALCIUM: 8.9 mg/dL (ref 8.4–10.5)
CO2: 29 mEq/L (ref 19–32)
CREATININE: 0.71 mg/dL (ref 0.40–1.20)
Chloride: 102 mEq/L (ref 96–112)
GFR: 105.36 mL/min (ref >60.00)
Glucose, Bld: 101 mg/dL — ABNORMAL HIGH (ref 70–99)
Potassium: 3.9 mEq/L (ref 3.5–5.1)
Sodium: 141 mEq/L (ref 135–145)

## 2018-09-07 MED ORDER — ATORVASTATIN CALCIUM 40 MG PO TABS: 40.0000 mg | ORAL_TABLET | Freq: Every day | ORAL | 1 refills | Status: DC

## 2018-09-07 MED ORDER — METFORMIN HCL 500 MG PO TABS: ORAL_TABLET | ORAL | 1 refills | Status: DC

## 2018-09-07 MED ORDER — OMEPRAZOLE 40 MG PO CPDR: 40.0000 mg | DELAYED_RELEASE_CAPSULE | Freq: Every day | ORAL | 1 refills | Status: DC

## 2018-09-07 NOTE — Progress Notes (Signed)
Subjective:    Patient ID: Elizabeth Vang, female    DOB: 1951-03-24, 68 y.o.   MRN: [id number]  HPI  HTN-  Last visit we increased losartan from 25mg  to 50mg .  BP Readings from Last 3 Encounters:  09/07/18 128/76  07/29/18 (!) 148/89  04/28/18 140/89   DM2- reports that she checks sugars occasionally, high 105, usually in the 90's.  Lab Results  Component Value Date   HGBA1C 6.3 07/29/2018   HGBA1C 6.4 04/28/2018   HGBA1C 6.3 12/16/2017   Lab Results  Component Value Date   MICROALBUR 0.2 12/16/2017   LDLCALC 74 07/29/2018   CREATININE 0.63 07/29/2018   Review of Systems See HPI  Past Medical History:  Diagnosis Date  . Arthritis   . Cancer ([place]) 12/12/2000   breast  . Diabetes ([place])   . Environmental allergies   . GERD (gastroesophageal reflux disease)   . Hyperlipidemia   . Hypertension   . Neuropathy    fingers and toes  . Osteoporosis      Social History   Socioeconomic History  . Marital status: Married    Spouse name: Not on file  . Number of children: Not on file  . Years of education: Not on file  . Highest education level: Not on file  Occupational History  . Not on file  Social Needs  . Financial resource strain: Not on file  . Food insecurity:    Worry: Not on file    Inability: Not on file  . Transportation needs:    Medical: Not on file    Non-medical: Not on file  Tobacco Use  . Smoking status: Never Smoker  . Smokeless tobacco: Never Used  Substance and Sexual Activity  . Alcohol use: Yes    Alcohol/week: 0.0 standard drinks    Comment: rarely drinks wine  . Drug use: Not on file  . Sexual activity: Not on file  Lifestyle  . Physical activity:    Days per week: Not on file    Minutes per session: Not on file  . Stress: Not on file  Relationships  . Social connections:    Talks on phone: Not on file    Gets together: Not on file    Attends religious service: Not on file    Active member of club or organization: Not  on file    Attends meetings of clubs or organizations: Not on file    Relationship status: Not on file  . Intimate partner violence:    Fear of current or ex partner: Not on file    Emotionally abused: Not on file    Physically abused: Not on file    Forced sexual activity: Not on file  Other Topics Concern  . 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 APPENDECTOMY  1982  . BREAST LUMPECTOMY Right 12/12/00   lumpectomy / axillary node dissection, sentinel node bx (cancer)  . GANGLION CYST EXCISION Right 1970   wrist  . LAPAROSCOPIC SALPINGO OOPHERECTOMY  1984   benign tumor    Family History  Problem Relation Age of Onset  . Hypertension Mother   . Stroke Mother   . Cancer Mother 95  uterine  . Heart disease Mother        CHF  . Lung disease Mother        interstital lung fibrosis  . Diabetes Father   . Lymphoma Father   . Colon cancer Paternal Grandfather 18  . Hypertension Brother   . Diabetes type II Brother   . Hypertension Sister        2 sisters  . Diabetes Mellitus II Sister        2 sisters    Allergies  Allergen Reactions  . Oxycodone-Acetaminophen     hallucinations    Current Outpatient Medications on File Prior to Visit  Medication Sig Dispense Refill  . aspirin EC 81 MG tablet Take 1 tablet (81 mg total) by mouth daily.    Marland Kitchen atorvastatin (LIPITOR) 40 MG tablet Take 1 tablet (40 mg total) by mouth daily. 90 tablet 1  . CALCIUM-MAGNESIUM-VITAMIN D PO Take 1 tablet by mouth daily as needed.     . Cholecalciferol (VITAMIN D3) 5000 UNIT/ML LIQD Take by mouth once a week.    . docusate sodium (COLACE) 100 MG capsule Take 100 mg by mouth 2 (two) times daily as needed.     . Glucosamine HCl 1500 MG TABS Take 1 tablet by mouth daily.    Marland Kitchen  glucose blood (ONETOUCH VERIO) test strip Use as instructed to check blood sugar once a day. DX E11.40 100 each 1  . loratadine (CLARITIN) 10 MG tablet Take 10 mg by mouth daily.    Marland Kitchen losartan (COZAAR) 50 MG tablet Take 1 tablet (50 mg total) by mouth daily. 90 tablet 1  . metFORMIN (GLUCOPHAGE) 500 MG tablet TAKE 2 TABLETS 2 TIMES     DAILY WITH MEALS 360 tablet 1  . Omega-3 Fatty Acids (OMEGA 3 PO) Take 1 capsule by mouth daily.    Marland Kitchen omeprazole (PRILOSEC) 40 MG capsule Take 1 capsule (40 mg total) by mouth daily. 90 capsule 1  . OVER THE COUNTER MEDICATION Take 500 mg by mouth daily. VITAMIN C /  ROSEHIPS    . vitamin E 400 UNIT capsule Take 400 Units by mouth daily as needed.      No current facility-administered medications on file prior to visit.     BP 128/76 (BP Location: Left Arm, Patient Position: Sitting, Cuff Size: Large)   Pulse 82   Temp 98.9 F (37.2 C) (Oral)   Resp 16   Ht 5\' 5"  (1.651 m)   Wt 201 lb (91.2 kg)   SpO2 98%   BMI 33.45 kg/m       Objective:   Physical Exam Constitutional:      Appearance: She is well-developed.  Neck:     Musculoskeletal: Neck supple.     Thyroid: No thyromegaly.  Cardiovascular:     Rate and Rhythm: Normal rate and regular rhythm.     Heart sounds: Normal heart sounds. No murmur.  Pulmonary:     Effort: Pulmonary effort is normal. No respiratory distress.     Breath sounds: Normal breath sounds. No wheezing.  Skin:    General: Skin is warm and dry.  Neurological:     Mental Status: She is alert and oriented to person, place, and time.  Psychiatric:        Behavior: Behavior normal.        Thought Content: Thought content normal.        Judgment: Judgment normal.  Assessment & Plan:  HTN- bp is stable.  Continue current meds.  Obtain follow up bmet.  DM2- glucose at goal, continue metformin.

## 2018-12-14 ENCOUNTER — Ambulatory Visit: Payer: Medicare Other | Admitting: Family

## 2019-02-06 ENCOUNTER — Other Ambulatory Visit: Payer: Self-pay | Admitting: Family

## 2019-02-13 ENCOUNTER — Other Ambulatory Visit: Payer: Self-pay | Admitting: Family

## 2019-03-08 ENCOUNTER — Ambulatory Visit: Payer: Medicare Other | Admitting: Family

## 2019-03-10 ENCOUNTER — Ambulatory Visit (INDEPENDENT_AMBULATORY_CARE_PROVIDER_SITE_OTHER): Payer: Medicare Other | Admitting: Family

## 2019-03-10 ENCOUNTER — Other Ambulatory Visit: Payer: Self-pay

## 2019-03-10 DIAGNOSIS — E1140 Type 2 diabetes mellitus with diabetic neuropathy, unspecified: Secondary | ICD-10-CM | POA: Diagnosis not present

## 2019-03-10 DIAGNOSIS — IMO0002 Reserved for concepts with insufficient information to code with codable children: Secondary | ICD-10-CM

## 2019-03-10 DIAGNOSIS — K219 Gastro-esophageal reflux disease without esophagitis: Secondary | ICD-10-CM

## 2019-03-10 DIAGNOSIS — I10 Essential (primary) hypertension: Secondary | ICD-10-CM | POA: Diagnosis not present

## 2019-03-10 DIAGNOSIS — E1165 Type 2 diabetes mellitus with hyperglycemia: Secondary | ICD-10-CM | POA: Diagnosis not present

## 2019-03-10 DIAGNOSIS — E785 Hyperlipidemia, unspecified: Secondary | ICD-10-CM

## 2019-03-10 MED ORDER — ATORVASTATIN CALCIUM 40 MG PO TABS: 40.0000 mg | ORAL_TABLET | Freq: Every day | ORAL | 1 refills | Status: DC

## 2019-03-10 MED ORDER — METFORMIN HCL 500 MG PO TABS: ORAL_TABLET | ORAL | 1 refills | Status: DC

## 2019-03-10 MED ORDER — LOSARTAN POTASSIUM 50 MG PO TABS: 50.0000 mg | ORAL_TABLET | Freq: Every day | ORAL | 1 refills | Status: DC

## 2019-03-10 NOTE — Progress Notes (Signed)
Virtual Visit via Video Note  I connected with Elizabeth Vang on 03/10/19 at  8:20 AM EDT by a video enabled telemedicine application and verified that I am speaking with the correct person using two identifiers.  Location: Patient: home Provider: work   I discussed the limitations of evaluation and management by telemedicine and the availability of in person appointments. The patient expressed understanding and agreed to proceed.  History of Present Illness:  Patient is a 68 yr old female who presents today for follow up.  DM2- maintained on metformin. Reports sugars 88-105. Continues metformin.   Lab Results  Component Value Date   HGBA1C 6.3 07/29/2018   HGBA1C 6.4 04/28/2018   HGBA1C 6.3 12/16/2017   Lab Results  Component Value Date   MICROALBUR 0.2 12/16/2017   LDLCALC 74 07/29/2018   CREATININE 0.71 09/07/2018   HTN- maintained on losartan.reports bp has been stable/improved since losartan was increased. Denies any significant  LE edema.    Reports 786-767 systolic over 20-94. BP Readings from Last 3 Encounters:  09/07/18 128/76  07/29/18 (!) 148/89  04/28/18 140/89   Hyperlipidemia- maintained on statin, denies myalgia.  Lab Results  Component Value Date   CHOL 128 07/29/2018   HDL 40.80 07/29/2018   LDLCALC 74 07/29/2018   TRIG 67.0 07/29/2018   CHOLHDL 3 07/29/2018   GERD- reports that symptoms are stable omeprazole. Able to skip a day or so but then symptoms return.   Past Medical History:  Diagnosis Date  . Arthritis   . Cancer ([place]) 12/12/2000   breast  . Diabetes ([place])   . Environmental allergies   . GERD (gastroesophageal reflux disease)   . Hyperlipidemia   . Hypertension   . Neuropathy    fingers and toes  . Osteoporosis      Social History   Socioeconomic History  . Marital status: Married    Spouse name: Not on file  . Number of children: Not on file  . Years of education: Not on file  . Highest education level: Not on file   Occupational History  . Not on file  Social Needs  . Financial resource strain: Not on file  . Food insecurity    Worry: Not on file    Inability: Not on file  . Transportation needs    Medical: Not on file    Non-medical: Not on file  Tobacco Use  . Smoking status: Never Smoker  . Smokeless tobacco: Never Used  Substance and Sexual Activity  . Alcohol use: Yes    Alcohol/week: 0.0 standard drinks    Comment: rarely drinks wine  . Drug use: Not on file  . Sexual activity: Not on file  Lifestyle  . Physical activity    Days per week: Not on file    Minutes per session: Not on file  . Stress: Not on file  Relationships  . Social Herbalist on phone: Not on file    Gets together: Not on file    Attends religious service: Not on file    Active member of club or organization: Not on file    Attends meetings of clubs or organizations: Not on file    Relationship status: Not on file  . Intimate partner violence    Fear of current or ex partner: Not on file    Emotionally abused: Not on file    Physically abused: Not on file    Forced sexual activity: Not on file  Other Topics Concern  . 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 APPENDECTOMY  1982  . BREAST LUMPECTOMY Right 12/12/00   lumpectomy / axillary node dissection, sentinel node bx (cancer)  . GANGLION CYST EXCISION Right 1970   wrist  . LAPAROSCOPIC SALPINGO OOPHERECTOMY  1984   benign tumor    Family History  Problem Relation Age of Onset  . Hypertension Mother   . Stroke Mother   . Cancer Mother 20       uterine  . Heart disease Mother        CHF  . Lung disease Mother        interstital lung fibrosis  . Diabetes Father   . Lymphoma Father   . Colon cancer Paternal Grandfather  41  . Hypertension Brother   . Diabetes type II Brother   . Hypertension Sister        2 sisters  . Diabetes Mellitus II Sister        2 sisters    Allergies  Allergen Reactions  . Oxycodone-Acetaminophen     hallucinations    Current Outpatient Medications on File Prior to Visit  Medication Sig Dispense Refill  . aspirin EC 81 MG tablet Take 1 tablet (81 mg total) by mouth daily.    Marland Kitchen CALCIUM-MAGNESIUM-VITAMIN D PO Take 1 tablet by mouth daily as needed.     . Cholecalciferol (VITAMIN D3) 5000 UNIT/ML LIQD Take by mouth once a week.    . docusate sodium (COLACE) 100 MG capsule Take 100 mg by mouth 2 (two) times daily as needed.     . Glucosamine HCl 1500 MG TABS Take 1 tablet by mouth daily.    Marland Kitchen glucose blood (ONETOUCH VERIO) test strip Use as instructed to check blood sugar once a day. DX E11.40 100 each 1  . loratadine (CLARITIN) 10 MG tablet Take 10 mg by mouth daily.    . Omega-3 Fatty Acids (OMEGA 3 PO) Take 1 capsule by mouth daily.    Marland Kitchen omeprazole (PRILOSEC) 40 MG capsule TAKE 1 CAPSULE DAILY 90 capsule 1  . OVER THE COUNTER MEDICATION Take 500 mg by mouth daily. VITAMIN C /  ROSEHIPS    . vitamin E 400 UNIT capsule Take 400 Units by mouth daily as needed.      No current facility-administered medications on file prior to visit.     There were no vitals taken for this visit.      Observations/Objective:   Gen: Awake, alert, no acute distress Resp: Breathing is even and non-labored Psych: calm/pleasant demeanor Neuro: Alert and Oriented x 3, + facial symmetry, speech is clear.   Assessment and Plan: HTN- bp is stable on current medication. Continue same.  DM2- clinically stable on metformin. Continue same. Obtain a1c.  Hyperlipidemia- tolerating statin. LDL at goal- continue current dose.  GERD- stable on PPI. Continue same.  Recommended that she schedule a lab visit at her convenience. She declines at this time due to covid.  Follow Up  Instructions:    I discussed the assessment and treatment plan with the patient. The patient was provided an opportunity to ask questions and all were answered. The patient agreed with the plan and demonstrated an understanding  of the instructions.   The patient was advised to call back or seek an in-person evaluation if the symptoms worsen or if the condition fails to improve as anticipated.  Nance Pear, NP

## 2019-05-15 ENCOUNTER — Other Ambulatory Visit: Payer: Self-pay | Admitting: Family

## 2019-05-17 ENCOUNTER — Other Ambulatory Visit (INDEPENDENT_AMBULATORY_CARE_PROVIDER_SITE_OTHER): Payer: Medicare Other

## 2019-05-17 ENCOUNTER — Ambulatory Visit (INDEPENDENT_AMBULATORY_CARE_PROVIDER_SITE_OTHER): Payer: Medicare Other

## 2019-05-17 DIAGNOSIS — E1140 Type 2 diabetes mellitus with diabetic neuropathy, unspecified: Secondary | ICD-10-CM

## 2019-05-17 DIAGNOSIS — Z23 Encounter for immunization: Secondary | ICD-10-CM

## 2019-05-17 DIAGNOSIS — E1165 Type 2 diabetes mellitus with hyperglycemia: Secondary | ICD-10-CM | POA: Diagnosis not present

## 2019-05-17 DIAGNOSIS — IMO0002 Reserved for concepts with insufficient information to code with codable children: Secondary | ICD-10-CM

## 2019-05-17 LAB — COMPREHENSIVE METABOLIC PANEL
ALT: 22 U/L (ref 0–35)
AST: 23 U/L (ref 0–37)
Albumin: 4.4 g/dL (ref 3.5–5.2)
Alkaline Phosphatase: 61 U/L (ref 39–117)
BUN: 10 mg/dL (ref 6–23)
CO2: 29 mEq/L (ref 19–32)
Calcium: 10.3 mg/dL (ref 8.4–10.5)
Chloride: 102 mEq/L (ref 96–112)
Creatinine, Ser: 0.75 mg/dL (ref 0.40–1.20)
GFR: 92.87 mL/min (ref >60.00)
Glucose, Bld: 74 mg/dL (ref 70–99)
Potassium: 4.4 mEq/L (ref 3.5–5.1)
Sodium: 140 mEq/L (ref 135–145)
Total Bilirubin: 0.4 mg/dL (ref 0.2–1.2)
Total Protein: 8 g/dL (ref 6.0–8.3)

## 2019-05-17 LAB — HEMOGLOBIN A1C: Hgb A1c MFr Bld: 6.5 % (ref 4.6–6.5)

## 2019-06-21 ENCOUNTER — Other Ambulatory Visit: Payer: Self-pay

## 2019-06-22 ENCOUNTER — Encounter: Payer: Self-pay | Admitting: Family

## 2019-06-22 ENCOUNTER — Ambulatory Visit (INDEPENDENT_AMBULATORY_CARE_PROVIDER_SITE_OTHER): Payer: Medicare Other | Admitting: Family

## 2019-06-22 ENCOUNTER — Other Ambulatory Visit: Payer: Self-pay

## 2019-06-22 VITALS — BP 139/86 | HR 82 | Temp 96.3°F | Resp 16 | Ht 65.0 in | Wt 203.0 lb

## 2019-06-22 DIAGNOSIS — E1140 Type 2 diabetes mellitus with diabetic neuropathy, unspecified: Secondary | ICD-10-CM

## 2019-06-22 DIAGNOSIS — IMO0002 Reserved for concepts with insufficient information to code with codable children: Secondary | ICD-10-CM

## 2019-06-22 DIAGNOSIS — E785 Hyperlipidemia, unspecified: Secondary | ICD-10-CM | POA: Diagnosis not present

## 2019-06-22 DIAGNOSIS — Z23 Encounter for immunization: Secondary | ICD-10-CM | POA: Diagnosis not present

## 2019-06-22 DIAGNOSIS — E1165 Type 2 diabetes mellitus with hyperglycemia: Secondary | ICD-10-CM | POA: Diagnosis not present

## 2019-06-22 DIAGNOSIS — K219 Gastro-esophageal reflux disease without esophagitis: Secondary | ICD-10-CM | POA: Diagnosis not present

## 2019-06-22 DIAGNOSIS — I10 Essential (primary) hypertension: Secondary | ICD-10-CM

## 2019-06-22 MED ORDER — SHINGRIX 50 MCG/0.5ML IM SUSR: INTRAMUSCULAR | 1 refills | Status: DC

## 2019-06-22 NOTE — Progress Notes (Signed)
Subjective:    Patient ID: Elizabeth Vang, female    DOB: [birth year], 68 y.o.   MRN: [id number]  HPI  Patient is a 68 yr old female who presents today for follow up. She states that her mother passed away 10 days ago. She was her primary caregiver.  Her mother died from complications of pulmonary fibrosis.   DM2- maintained on metformin. Reports that she checks sugars at home 90-120's.  Lab Results  Component Value Date   HGBA1C 6.5 05/17/2019   HGBA1C 6.3 07/29/2018   HGBA1C 6.4 04/28/2018   Lab Results  Component Value Date   MICROALBUR 0.2 12/16/2017   LDLCALC 74 07/29/2018   CREATININE 0.75 05/17/2019   HTN- maintained on losartan 50mg .  BP Readings from Last 3 Encounters:  06/22/19 139/86  09/07/18 128/76  07/29/18 (!) 148/89   Hyperlipidemia- continues lipitor 40mg .  Lab Results  Component Value Date   CHOL 128 07/29/2018   HDL 40.80 07/29/2018   LDLCALC 74 07/29/2018   TRIG 67.0 07/29/2018   CHOLHDL 3 07/29/2018   GERD- .maintained on omeprazole.     Review of Systems   See HPI  Past Medical History:  Diagnosis Date  . Arthritis   . Cancer ([place]) 12/12/2000   breast  . Diabetes ([place])   . Environmental allergies   . GERD (gastroesophageal reflux disease)   . Hyperlipidemia   . Hypertension   . Neuropathy    fingers and toes  . Osteoporosis      Social History   Socioeconomic History  . Marital status: Married    Spouse name: Not on file  . Number of children: Not on file  . Years of education: Not on file  . Highest education level: Not on file  Occupational History  . Not on file  Social Needs  . Financial resource strain: Not on file  . Food insecurity    Worry: Not on file    Inability: Not on file  . Transportation needs    Medical: Not on file    Non-medical: Not on file  Tobacco Use  . Smoking status: Never Smoker  . Smokeless tobacco: Never Used  Substance and Sexual Activity  . Alcohol use: Yes    Alcohol/week: 0.0  standard drinks    Comment: rarely drinks wine  . Drug use: Not on file  . Sexual activity: Not on file  Lifestyle  . Physical activity    Days per week: Not on file    Minutes per session: Not on file  . Stress: Not on file  Relationships  . Social Herbalist on phone: Not on file    Gets together: Not on file    Attends religious service: Not on file    Active member of club or organization: Not on file    Attends meetings of clubs or organizations: Not on file    Relationship status: Not on file  . Intimate partner violence    Fear of current or ex partner: Not on file    Emotionally abused: Not on file    Physically abused: Not on file    Forced sexual activity: Not on file  Other Topics Concern  . 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 APPENDECTOMY  1982  . BREAST LUMPECTOMY Right 12/12/00   lumpectomy / axillary node dissection, sentinel node bx (cancer)  . GANGLION CYST EXCISION Right 1970   wrist  . LAPAROSCOPIC SALPINGO OOPHERECTOMY  1984   benign tumor    Family History  Problem Relation Age of Onset  . Hypertension Mother   . Stroke Mother   . Cancer Mother 54       uterine  . Heart disease Mother        CHF  . Lung disease Mother        interstital lung fibrosis  . Diabetes Father   . Lymphoma Father   . Colon cancer Paternal Grandfather 27  . Hypertension Brother   . Diabetes type II Brother   . Hypertension Sister        2 sisters  . Diabetes Mellitus II Sister        2 sisters    Allergies  Allergen Reactions  . Oxycodone-Acetaminophen     hallucinations    Current Outpatient Medications on File Prior to Visit  Medication Sig Dispense Refill  . aspirin EC 81 MG tablet Take 1 tablet (81 mg total) by mouth daily.     Marland Kitchen atorvastatin (LIPITOR) 40 MG tablet Take 1 tablet (40 mg total) by mouth daily. 90 tablet 1  . CALCIUM-MAGNESIUM-VITAMIN D PO Take 1 tablet by mouth daily as needed.     . Cholecalciferol (VITAMIN D3) 5000 UNIT/ML LIQD Take by mouth once a week.    . docusate sodium (COLACE) 100 MG capsule Take 100 mg by mouth 2 (two) times daily as needed.     . Glucosamine HCl 1500 MG TABS Take 1 tablet by mouth daily.    Marland Kitchen glucose blood (ONETOUCH VERIO) test strip Use as instructed to check blood sugar once a day. DX E11.40 100 each 1  . loratadine (CLARITIN) 10 MG tablet Take 10 mg by mouth daily.    Marland Kitchen losartan (COZAAR) 50 MG tablet Take 1 tablet (50 mg total) by mouth daily. 90 tablet 1  . metFORMIN (GLUCOPHAGE) 500 MG tablet TAKE 2 TABLETS 2 TIMES     DAILY WITH MEALS 360 tablet 1  . Omega-3 Fatty Acids (OMEGA 3 PO) Take 1 capsule by mouth daily.    Marland Kitchen omeprazole (PRILOSEC) 40 MG capsule TAKE 1 CAPSULE DAILY 90 capsule 1  . OVER THE COUNTER MEDICATION Take 500 mg by mouth daily. VITAMIN C /  ROSEHIPS    . vitamin E 400 UNIT capsule Take 400 Units by mouth daily as needed.      No current facility-administered medications on file prior to visit.     BP 139/86 (BP Location: Left Arm, Patient Position: Sitting, Cuff Size: Large)   Pulse 82   Temp (!) 96.3 F (35.7 C) (Temporal)   Resp 16   Ht 5\' 5"  (1.651 m)   Wt 203 lb (92.1 kg)   SpO2 99%   BMI 33.78 kg/m       Objective:   Physical Exam Constitutional:      Appearance: She is well-developed.  Neck:     Musculoskeletal: Neck supple.     Thyroid: No thyromegaly.  Cardiovascular:     Rate and Rhythm: Normal rate and regular rhythm.     Heart sounds: Normal heart sounds. No murmur.  Pulmonary:     Effort: Pulmonary effort is normal. No respiratory distress.     Breath sounds:  Normal breath sounds. No wheezing.  Skin:    General: Skin is warm and dry.  Neurological:     Mental Status: She is alert and oriented to person, place, and  time.  Psychiatric:        Behavior: Behavior normal.        Thought Content: Thought content normal.        Judgment: Judgment normal.           Assessment & Plan:  HTN- bp stable on current meds. Continue same.    Hyperlipidemia- maintained on statin. Plan flp next visit.  DM2- A1C at goal. Continue current regimen. Pneumovax booster today  GERD- remains well controlled on PPI, continue same.

## 2019-08-20 ENCOUNTER — Other Ambulatory Visit: Payer: Self-pay | Admitting: Family

## 2019-08-25 ENCOUNTER — Other Ambulatory Visit (HOSPITAL_BASED_OUTPATIENT_CLINIC_OR_DEPARTMENT_OTHER): Payer: Self-pay | Admitting: Family

## 2019-08-25 DIAGNOSIS — Z1231 Encounter for screening mammogram for malignant neoplasm of breast: Secondary | ICD-10-CM

## 2019-09-22 ENCOUNTER — Ambulatory Visit (HOSPITAL_BASED_OUTPATIENT_CLINIC_OR_DEPARTMENT_OTHER)
Admission: RE | Admit: 2019-09-22 | Discharge: 2019-09-22 | Disposition: A | Discharge status: Home / Self Care | Payer: Medicare Other | Source: Ambulatory Visit | Attending: Family | Admitting: Family

## 2019-09-22 ENCOUNTER — Other Ambulatory Visit: Payer: Self-pay

## 2019-09-22 ENCOUNTER — Ambulatory Visit (INDEPENDENT_AMBULATORY_CARE_PROVIDER_SITE_OTHER): Payer: Medicare Other | Admitting: Family

## 2019-09-22 ENCOUNTER — Encounter: Payer: Self-pay | Admitting: Family

## 2019-09-22 VITALS — BP 141/81 | HR 83 | Temp 96.5°F | Resp 16 | Ht 66.0 in | Wt 200.0 lb

## 2019-09-22 DIAGNOSIS — Z1231 Encounter for screening mammogram for malignant neoplasm of breast: Secondary | ICD-10-CM | POA: Insufficient documentation

## 2019-09-22 DIAGNOSIS — E1165 Type 2 diabetes mellitus with hyperglycemia: Secondary | ICD-10-CM

## 2019-09-22 DIAGNOSIS — E1140 Type 2 diabetes mellitus with diabetic neuropathy, unspecified: Secondary | ICD-10-CM | POA: Diagnosis not present

## 2019-09-22 DIAGNOSIS — K219 Gastro-esophageal reflux disease without esophagitis: Secondary | ICD-10-CM

## 2019-09-22 DIAGNOSIS — E785 Hyperlipidemia, unspecified: Secondary | ICD-10-CM

## 2019-09-22 DIAGNOSIS — IMO0002 Reserved for concepts with insufficient information to code with codable children: Secondary | ICD-10-CM

## 2019-09-22 DIAGNOSIS — I10 Essential (primary) hypertension: Secondary | ICD-10-CM

## 2019-09-22 LAB — COMPREHENSIVE METABOLIC PANEL
ALT: 25 U/L (ref 0–35)
AST: 23 U/L (ref 0–37)
Albumin: 4.4 g/dL (ref 3.5–5.2)
Alkaline Phosphatase: 56 U/L (ref 39–117)
BUN: 14 mg/dL (ref 6–23)
CO2: 28 mEq/L (ref 19–32)
Calcium: 9.5 mg/dL (ref 8.4–10.5)
Chloride: 101 mEq/L (ref 96–112)
Creatinine, Ser: 0.73 mg/dL (ref 0.40–1.20)
GFR: 95.71 mL/min (ref >60.00)
Glucose, Bld: 91 mg/dL (ref 70–99)
Potassium: 4.2 mEq/L (ref 3.5–5.1)
Sodium: 138 mEq/L (ref 135–145)
Total Bilirubin: 0.4 mg/dL (ref 0.2–1.2)
Total Protein: 7.3 g/dL (ref 6.0–8.3)

## 2019-09-22 LAB — LIPID PANEL
Cholesterol: 130 mg/dL (ref 0–200)
HDL: 38.3 mg/dL — ABNORMAL LOW (ref >39.00)
LDL Cholesterol: 75 mg/dL (ref 0–99)
NonHDL: 91.68
Total CHOL/HDL Ratio: 3
Triglycerides: 81 mg/dL (ref 0.0–149.0)
VLDL: 16.2 mg/dL (ref 0.0–40.0)

## 2019-09-22 LAB — HEMOGLOBIN A1C: Hgb A1c MFr Bld: 6.3 % (ref 4.6–6.5)

## 2019-09-22 MED ORDER — ATORVASTATIN CALCIUM 40 MG PO TABS: 40.0000 mg | ORAL_TABLET | Freq: Every day | ORAL | 1 refills | Status: DC

## 2019-09-22 MED ORDER — METFORMIN HCL 500 MG PO TABS: ORAL_TABLET | ORAL | 1 refills | Status: DC

## 2019-09-22 MED ORDER — LOSARTAN POTASSIUM 50 MG PO TABS: 50.0000 mg | ORAL_TABLET | Freq: Every day | ORAL | 1 refills | Status: DC

## 2019-09-22 NOTE — Patient Instructions (Signed)
Please schedule eye exam when you are able.

## 2019-09-22 NOTE — Progress Notes (Signed)
Subjective:    Patient ID: Elizabeth Vang, female    DOB: 09-07-1950, 69 y.o.   MRN: [id number]  HPI   Patient is a 69 yr old female who presents today for follow up.  She states that her mother passed away last year and since her mother passed, she has been caring for her husband who had a stroke and 2 recent surgeries.  DM2- maintained on metformin. Reports sugars 90-110 fasting when she checks.  Lab Results  Component Value Date   HGBA1C 6.5 05/17/2019   HGBA1C 6.3 07/29/2018   HGBA1C 6.4 04/28/2018   Lab Results  Component Value Date   MICROALBUR 0.2 12/16/2017   LDLCALC 74 07/29/2018   CREATININE 0.75 05/17/2019   HTN- maintained on losartan. Denies swelling BP Readings from Last 3 Encounters:  09/22/19 (!) 141/81  06/22/19 139/86  09/07/18 128/76   GERD- maintained on omeprazole.  Reports that her symptoms are controlled.  She is able to skip omeprazole for 1 or 2 days but then symptoms return.  Hyperlipidemia- maintained on atorvastatin.  Dneies myalgia.   Lab Results  Component Value Date   CHOL 128 07/29/2018   HDL 40.80 07/29/2018   LDLCALC 74 07/29/2018   TRIG 67.0 07/29/2018   CHOLHDL 3 07/29/2018    Review of Systems    see HPI  Past Medical History:  Diagnosis Date  . Arthritis   . Cancer ([place]) 12/12/2000   breast  . Diabetes ([place])   . Environmental allergies   . GERD (gastroesophageal reflux disease)   . Hyperlipidemia   . Hypertension   . Neuropathy    fingers and toes  . Osteoporosis      Social History   Socioeconomic History  . Marital status: Married    Spouse name: Not on file  . Number of children: Not on file  . Years of education: Not on file  . Highest education level: Not on file  Occupational History  . Not on file  Tobacco Use  . Smoking status: Never Smoker  . Smokeless tobacco: Never Used  Substance and Sexual Activity  . Alcohol use: Yes    Alcohol/week: 0.0 standard drinks    Comment: rarely drinks wine    . Drug use: Not on file  . Sexual activity: Not on file  Other Topics Concern  . 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 Difficulty of Paying Living Expenses: Not on file  Food Insecurity:   . Worried About Charity fundraiser in the Last Year: Not on file  . Ran Out of Food in the Last Year: Not on file  Transportation Needs:   . Lack of Transportation (Medical): Not on file  . Lack of Transportation (Non-Medical): Not on file  Physical Activity:   . Days of Exercise per Week: Not on file  . Minutes of Exercise per Session: Not on file  Stress:   . Feeling of Stress : Not on file  Social Connections:   . Frequency of Communication with Friends and Family: Not on file  . Frequency of Social Gatherings with Friends and Family: Not on file  . Attends Religious Services: Not on file  . Active Member of Clubs  or Organizations: Not on file  . Attends Archivist Meetings: Not on file  . Marital Status: Not on file  Intimate Partner Violence:   . Fear of Current or Ex-Partner: Not on file  . Emotionally Abused: Not on file  . Physically Abused: Not on file  . Sexually Abused: Not on file    Past Surgical History:  Procedure Laterality Date  . APPENDECTOMY  1982  . BREAST LUMPECTOMY Right 12/12/00   lumpectomy / axillary node dissection, sentinel node bx (cancer)  . GANGLION CYST EXCISION Right 1970   wrist  . LAPAROSCOPIC SALPINGO OOPHERECTOMY  1984   benign tumor    Family History  Problem Relation Age of Onset  . Hypertension Mother   . Stroke Mother   . Cancer Mother 55       uterine  . Heart disease Mother        CHF  . Lung disease Mother        interstital lung fibrosis  . Diabetes Father   . Lymphoma Father   . Colon cancer  Paternal Grandfather 64  . Hypertension Brother   . Diabetes type II Brother   . Hypertension Sister        2 sisters  . Diabetes Mellitus II Sister        2 sisters    Allergies  Allergen Reactions  . Oxycodone-Acetaminophen     hallucinations    Current Outpatient Medications on File Prior to Visit  Medication Sig Dispense Refill  . aspirin EC 81 MG tablet Take 1 tablet (81 mg total) by mouth daily.    Marland Kitchen atorvastatin (LIPITOR) 40 MG tablet Take 1 tablet (40 mg total) by mouth daily. 90 tablet 1  . CALCIUM-MAGNESIUM-VITAMIN D PO Take 1 tablet by mouth daily as needed.     . Cholecalciferol (VITAMIN D3) 5000 UNIT/ML LIQD Take by mouth once a week.    . docusate sodium (COLACE) 100 MG capsule Take 100 mg by mouth 2 (two) times daily as needed.     . Glucosamine HCl 1500 MG TABS Take 1 tablet by mouth daily.    Marland Kitchen glucose blood (ONETOUCH VERIO) test strip Use as instructed to check blood sugar once a day. DX E11.40 100 each 1  . loratadine (CLARITIN) 10 MG tablet Take 10 mg by mouth daily.    Marland Kitchen losartan (COZAAR) 50 MG tablet Take 1 tablet (50 mg total) by mouth daily. 90 tablet 1  . metFORMIN (GLUCOPHAGE) 500 MG tablet TAKE 2 TABLETS 2 TIMES     DAILY WITH MEALS 360 tablet 1  . Omega-3 Fatty Acids (OMEGA 3 PO) Take 1 capsule by mouth daily.    Marland Kitchen omeprazole (PRILOSEC) 40 MG capsule TAKE 1 CAPSULE DAILY 90 capsule 1  . OVER THE COUNTER MEDICATION Take 500 mg by mouth daily. VITAMIN C /  ROSEHIPS    . vitamin E 400 UNIT capsule Take 400 Units by mouth daily as needed.     . Zoster Vaccine Adjuvanted [facility]) injection Inject 0.5mg  IM now and again in 2-6 months. 0.5 mL 1   No current facility-administered medications on file prior to visit.    BP (!) 141/81 (BP Location: Left Arm, Patient Position: Sitting, Cuff Size: Small)   Pulse 83   Temp (!) 96.5 F (35.8 C) (Temporal)   Resp 16   Ht 5\' 6"  (1.676 m)   Wt 200 lb (90.7 kg)   SpO2 100%   BMI 32.28 kg/m  Objective:    Physical Exam Constitutional:      Appearance: She is well-developed.  Neck:     Thyroid: No thyromegaly.  Cardiovascular:     Rate and Rhythm: Normal rate and regular rhythm.     Heart sounds: Normal heart sounds. No murmur.  Pulmonary:     Effort: Pulmonary effort is normal. No respiratory distress.     Breath sounds: Normal breath sounds. No wheezing.  Musculoskeletal:     Cervical back: Neck supple.  Skin:    General: Skin is warm and dry.  Neurological:     Mental Status: She is alert and oriented to person, place, and time.  Psychiatric:        Behavior: Behavior normal.        Thought Content: Thought content normal.        Judgment: Judgment normal.           Assessment & Plan:  HTN- blood pressure is stable. Continue losartan.  DM2- clinically stable on metformin.    Hyperlipidemia- tolerating statin. Obtain lipid panel.  GERD- stable on PPI, continue same.  This visit occurred during the SARS-CoV-2 public health emergency.  Safety protocols were in place, including screening questions prior to the visit, additional usage of staff PPE, and extensive cleaning of exam room while observing appropriate contact time as indicated for disinfecting solutions.

## 2020-01-31 ENCOUNTER — Ambulatory Visit (INDEPENDENT_AMBULATORY_CARE_PROVIDER_SITE_OTHER): Payer: Medicare Other | Admitting: Family

## 2020-01-31 ENCOUNTER — Encounter: Payer: Self-pay | Admitting: Family

## 2020-01-31 ENCOUNTER — Other Ambulatory Visit: Payer: Self-pay

## 2020-01-31 VITALS — BP 121/80 | HR 78 | Temp 97.5°F | Resp 16 | Ht 66.0 in | Wt 205.0 lb

## 2020-01-31 DIAGNOSIS — E1165 Type 2 diabetes mellitus with hyperglycemia: Secondary | ICD-10-CM

## 2020-01-31 DIAGNOSIS — IMO0002 Reserved for concepts with insufficient information to code with codable children: Secondary | ICD-10-CM

## 2020-01-31 DIAGNOSIS — E1140 Type 2 diabetes mellitus with diabetic neuropathy, unspecified: Secondary | ICD-10-CM | POA: Diagnosis not present

## 2020-01-31 DIAGNOSIS — I10 Essential (primary) hypertension: Secondary | ICD-10-CM | POA: Diagnosis not present

## 2020-01-31 DIAGNOSIS — E785 Hyperlipidemia, unspecified: Secondary | ICD-10-CM | POA: Diagnosis not present

## 2020-01-31 DIAGNOSIS — K219 Gastro-esophageal reflux disease without esophagitis: Secondary | ICD-10-CM | POA: Diagnosis not present

## 2020-01-31 LAB — BASIC METABOLIC PANEL
BUN: 21 mg/dL (ref 6–23)
CO2: 30 mEq/L (ref 19–32)
Calcium: 9.7 mg/dL (ref 8.4–10.5)
Chloride: 100 mEq/L (ref 96–112)
Creatinine, Ser: 0.75 mg/dL (ref 0.40–1.20)
GFR: 92.67 mL/min (ref >60.00)
Glucose, Bld: 104 mg/dL — ABNORMAL HIGH (ref 70–99)
Potassium: 4.3 mEq/L (ref 3.5–5.1)
Sodium: 136 mEq/L (ref 135–145)

## 2020-01-31 LAB — HEMOGLOBIN A1C: Hgb A1c MFr Bld: 6.4 % (ref 4.6–6.5)

## 2020-01-31 MED ORDER — OMEPRAZOLE 40 MG PO CPDR: 40.0000 mg | DELAYED_RELEASE_CAPSULE | Freq: Every day | ORAL | 1 refills | Status: DC

## 2020-01-31 MED ORDER — ATORVASTATIN CALCIUM 40 MG PO TABS: 40.0000 mg | ORAL_TABLET | Freq: Every day | ORAL | 1 refills | Status: DC

## 2020-01-31 MED ORDER — LOSARTAN POTASSIUM 50 MG PO TABS: 50.0000 mg | ORAL_TABLET | Freq: Every day | ORAL | 1 refills | Status: DC

## 2020-01-31 NOTE — Progress Notes (Signed)
Subjective:    Patient ID: Elizabeth Vang, female    DOB: 07-22-1951, 69 y.o.   MRN: [id number]  HPI   Patient is a 69 yr old female who presents today for follow up.   DM2- maintained on metformin.  Reports that her home readings 90-118.  Lab Results  Component Value Date   HGBA1C 6.3 09/22/2019   HGBA1C 6.5 05/17/2019   HGBA1C 6.3 07/29/2018   Lab Results  Component Value Date   MICROALBUR 0.2 12/16/2017   LDLCALC 75 09/22/2019   CREATININE 0.73 09/22/2019   HTN-maintained on losartan. BP Readings from Last 3 Encounters:  01/31/20 121/80  09/22/19 (!) 141/81  06/22/19 139/86   Hyperlipidemia- maintained on statin. Denies myalgia.  Lab Results  Component Value Date   CHOL 130 09/22/2019   HDL 38.30 (L) 09/22/2019   LDLCALC 75 09/22/2019   TRIG 81.0 09/22/2019   CHOLHDL 3 09/22/2019      Review of Systems See HPI  Past Medical History:  Diagnosis Date  . Arthritis   . Cancer ([place]) 12/12/2000   breast  . Diabetes ([place])   . Environmental allergies   . GERD (gastroesophageal reflux disease)   . Hyperlipidemia   . Hypertension   . Neuropathy    fingers and toes  . Osteoporosis      Social History   Socioeconomic History  . Marital status: Married    Spouse name: Not on file  . Number of children: Not on file  . Years of education: Not on file  . Highest education level: Not on file  Occupational History  . Not on file  Tobacco Use  . Smoking status: Never Smoker  . Smokeless tobacco: Never Used  Substance and Sexual Activity  . Alcohol use: Yes    Alcohol/week: 0.0 standard drinks    Comment: rarely drinks wine  . Drug use: Not on file  . Sexual activity: Not on file  Other Topics Concern  . 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 Difficulty of Paying Living Expenses:   Food Insecurity:   . Worried About Charity fundraiser in the Last Year:   . Arboriculturist in the Last Year:   Transportation Needs:   . Film/video editor (Medical):   Marland Kitchen Lack of Transportation (Non-Medical):   Physical Activity:   . Days of Exercise per Week:   . Minutes of Exercise per Session:   Stress:   . Feeling of Stress :   Social Connections:   . Frequency of Communication with Friends and Family:   . Frequency of Social Gatherings with Friends and Family:   . Attends Religious Services:   . Active Member of Clubs or Organizations:   . Attends Archivist Meetings:   Marland Kitchen Marital Status:   Intimate Partner Violence:   . Fear of Current or Ex-Partner:   . Emotionally Abused:   Marland Kitchen Physically Abused:   . Sexually Abused:     Past Surgical History:  Procedure Laterality Date  . APPENDECTOMY  1982  . BREAST LUMPECTOMY Right 12/12/00   lumpectomy / axillary node dissection, sentinel node bx (cancer)  . GANGLION CYST EXCISION Right 1970  wrist  . LAPAROSCOPIC SALPINGO OOPHERECTOMY  1984   benign tumor    Family History  Problem Relation Age of Onset  . Hypertension Mother   . Stroke Mother   . Cancer Mother 65       uterine  . Heart disease Mother        CHF  . Lung disease Mother        interstital lung fibrosis  . Diabetes Father   . Lymphoma Father   . Colon cancer Paternal Grandfather 53  . Hypertension Brother   . Diabetes type II Brother   . Hypertension Sister        2 sisters  . Diabetes Mellitus II Sister        2 sisters    Allergies  Allergen Reactions  . Oxycodone-Acetaminophen     hallucinations    Current Outpatient Medications on File Prior to Visit  Medication Sig Dispense Refill  . aspirin EC 81 MG tablet Take 1 tablet (81 mg total) by mouth daily.    Marland Kitchen atorvastatin (LIPITOR) 40 MG tablet Take 1 tablet (40 mg total) by mouth daily. 90 tablet 1  .  CALCIUM-MAGNESIUM-VITAMIN D PO Take 1 tablet by mouth daily as needed.     . Cholecalciferol (VITAMIN D3) 5000 UNIT/ML LIQD Take by mouth once a week.    . docusate sodium (COLACE) 100 MG capsule Take 100 mg by mouth 2 (two) times daily as needed.     . Glucosamine HCl 1500 MG TABS Take 1 tablet by mouth daily.    Marland Kitchen glucose blood (ONETOUCH VERIO) test strip Use as instructed to check blood sugar once a day. DX E11.40 100 each 1  . loratadine (CLARITIN) 10 MG tablet Take 10 mg by mouth daily.    Marland Kitchen losartan (COZAAR) 50 MG tablet Take 1 tablet (50 mg total) by mouth daily. 90 tablet 1  . metFORMIN (GLUCOPHAGE) 500 MG tablet TAKE 2 TABLETS 2 TIMES     DAILY WITH MEALS 360 tablet 1  . Omega-3 Fatty Acids (OMEGA 3 PO) Take 1 capsule by mouth daily.    Marland Kitchen omeprazole (PRILOSEC) 40 MG capsule TAKE 1 CAPSULE DAILY 90 capsule 1  . OVER THE COUNTER MEDICATION Take 500 mg by mouth daily. VITAMIN C /  ROSEHIPS    . vitamin E 400 UNIT capsule Take 400 Units by mouth daily as needed.     . Zoster Vaccine Adjuvanted [facility]) injection Inject 0.5mg  IM now and again in 2-6 months. 0.5 mL 1   No current facility-administered medications on file prior to visit.    BP 121/80 (BP Location: Left Arm, Patient Position: Sitting, Cuff Size: Small)   Pulse 78   Temp (!) 97.5 F (36.4 C) (Temporal)   Resp 16   Ht 5\' 6"  (1.676 m)   Wt 205 lb (93 kg)   SpO2 100%   BMI 33.09 kg/m       Objective:   Physical Exam Constitutional:      Appearance: She is well-developed.  Neck:     Thyroid: No thyromegaly.  Cardiovascular:     Rate and Rhythm: Normal rate and regular rhythm.     Heart sounds: Normal heart sounds. No murmur.  Pulmonary:     Effort: Pulmonary effort is normal. No respiratory distress.     Breath sounds: Normal breath sounds. No wheezing.  Musculoskeletal:     Cervical back: Neck supple.  Skin:    General: Skin is warm and dry.  Neurological:     Mental Status: She is alert and oriented to  person, place, and time.  Psychiatric:        Behavior: Behavior normal.        Thought Content: Thought content normal.        Judgment: Judgment normal.           Assessment & Plan:  HTN- bp at goal, continue current meds.  DM2- clinically stable on metformin, obtain follow up A1C. Due for DM eye exam. Pt wishes to schedule on her own.   GERD- reports symptoms well controlled on omeprazole, continue same.  Hyperlipidemia- LDL at goal on statin, continue same.  This visit occurred during the SARS-CoV-2 public health emergency.  Safety protocols were in place, including screening questions prior to the visit, additional usage of staff PPE, and extensive cleaning of exam room while observing appropriate contact time as indicated for disinfecting solutions.

## 2020-03-10 ENCOUNTER — Other Ambulatory Visit: Payer: Self-pay | Admitting: Family

## 2020-06-06 ENCOUNTER — Encounter: Payer: Self-pay | Admitting: Family

## 2020-06-06 ENCOUNTER — Other Ambulatory Visit: Payer: Self-pay

## 2020-06-06 ENCOUNTER — Other Ambulatory Visit (HOSPITAL_COMMUNITY)
Admission: RE | Admit: 2020-06-06 | Discharge: 2020-06-06 | Disposition: A | Discharge status: Home / Self Care | Payer: Medicare Other | Source: Ambulatory Visit | Attending: Family | Admitting: Family

## 2020-06-06 ENCOUNTER — Ambulatory Visit (INDEPENDENT_AMBULATORY_CARE_PROVIDER_SITE_OTHER): Payer: Medicare Other | Admitting: Family

## 2020-06-06 VITALS — BP 147/77 | HR 82 | Temp 98.7°F | Resp 16 | Ht 65.0 in | Wt 210.0 lb

## 2020-06-06 DIAGNOSIS — E785 Hyperlipidemia, unspecified: Secondary | ICD-10-CM | POA: Diagnosis not present

## 2020-06-06 DIAGNOSIS — E1140 Type 2 diabetes mellitus with diabetic neuropathy, unspecified: Secondary | ICD-10-CM | POA: Diagnosis not present

## 2020-06-06 DIAGNOSIS — IMO0002 Reserved for concepts with insufficient information to code with codable children: Secondary | ICD-10-CM

## 2020-06-06 DIAGNOSIS — E1165 Type 2 diabetes mellitus with hyperglycemia: Secondary | ICD-10-CM

## 2020-06-06 DIAGNOSIS — I10 Essential (primary) hypertension: Secondary | ICD-10-CM | POA: Diagnosis not present

## 2020-06-06 DIAGNOSIS — N898 Other specified noninflammatory disorders of vagina: Secondary | ICD-10-CM

## 2020-06-06 DIAGNOSIS — Z23 Encounter for immunization: Secondary | ICD-10-CM

## 2020-06-06 DIAGNOSIS — N761 Subacute and chronic vaginitis: Secondary | ICD-10-CM

## 2020-06-06 MED ORDER — LOSARTAN POTASSIUM 50 MG PO TABS: 50.0000 mg | ORAL_TABLET | Freq: Every day | ORAL | 1 refills | Status: DC

## 2020-06-06 MED ORDER — OMEPRAZOLE 40 MG PO CPDR: 40.0000 mg | DELAYED_RELEASE_CAPSULE | Freq: Every day | ORAL | 1 refills | Status: DC

## 2020-06-06 NOTE — Progress Notes (Signed)
Subjective:    Patient ID: Elizabeth Vang, female    DOB: June 18, 1951, 69 y.o.   MRN: [id number]  HPI  Patient is a 69 yr old female who presents today for follow up.  DM2- maintained on metformin 500mg  bid. Reports home sugars 95-120 Lab Results  Component Value Date   HGBA1C 6.4 01/31/2020   HGBA1C 6.3 09/22/2019   HGBA1C 6.5 05/17/2019   Lab Results  Component Value Date   MICROALBUR 0.2 12/16/2017   LDLCALC 75 09/22/2019   CREATININE 0.75 01/31/2020   HTN- maintained on losartan 50mg  BP Readings from Last 3 Encounters:  06/06/20 (!) 147/77  01/31/20 121/80  09/22/19 (!) 141/81   Vaginal itching- reports dryness and itching.   Hyperlipidemia- maintained on atorvastatin 40 mg.   Lab Results  Component Value Date   CHOL 130 09/22/2019   HDL 38.30 (L) 09/22/2019   LDLCALC 75 09/22/2019   TRIG 81.0 09/22/2019   CHOLHDL 3 09/22/2019     Review of Systems    see HPI  Past Medical History:  Diagnosis Date  . Arthritis   . Cancer ([place]) 12/12/2000   breast  . Diabetes ([place])   . Environmental allergies   . GERD (gastroesophageal reflux disease)   . Hyperlipidemia   . Hypertension   . Neuropathy    fingers and toes  . Osteoporosis      Social History   Socioeconomic History  . Marital status: Married    Spouse name: Not on file  . Number of children: Not on file  . Years of education: Not on file  . Highest education level: Not on file  Occupational History  . Not on file  Tobacco Use  . Smoking status: Never Smoker  . Smokeless tobacco: Never Used  Substance and Sexual Activity  . Alcohol use: Yes    Alcohol/week: 0.0 standard drinks    Comment: rarely drinks wine  . Drug use: Not on file  . Sexual activity: Not on file  Other Topics Concern  . 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 Difficulty of Paying Living Expenses: Not on file  Food Insecurity:   . Worried About Charity fundraiser in the Last Year: Not on file  . Ran Out of Food in the Last Year: Not on file  Transportation Needs:   . Lack of Transportation (Medical): Not on file  . Lack of Transportation (Non-Medical): Not on file  Physical Activity:   . Days of Exercise per Week: Not on file  . Minutes of Exercise per Session: Not on file  Stress:   . Feeling of Stress : Not on file  Social Connections:   . Frequency of Communication with Friends and Family: Not on file  . Frequency of Social Gatherings with Friends and Family: Not on file  . Attends Religious Services: Not on file  . Active Member of Clubs or Organizations: Not on file  . Attends Archivist Meetings: Not on file  . Marital Status: Not on file  Intimate Partner Violence:   . Fear of Current or Ex-Partner: Not on file  . Emotionally Abused: Not on file  . Physically Abused: Not on file  .  Sexually Abused: Not on file    Past Surgical History:  Procedure Laterality Date  . APPENDECTOMY  1982  . BREAST LUMPECTOMY Right 12/12/00   lumpectomy / axillary node dissection, sentinel node bx (cancer)  . GANGLION CYST EXCISION Right 1970   wrist  . LAPAROSCOPIC SALPINGO OOPHERECTOMY  1984   benign tumor    Family History  Problem Relation Age of Onset  . Hypertension Mother   . Stroke Mother   . Cancer Mother 80       uterine  . Heart disease Mother        CHF  . Lung disease Mother        interstital lung fibrosis  . Diabetes Father   . Lymphoma Father   . Colon cancer Paternal Grandfather 34  . Hypertension Brother   . Diabetes type II Brother   . Hypertension Sister        2 sisters  . Diabetes Mellitus II Sister        2 sisters    Allergies  Allergen Reactions  . Oxycodone-Acetaminophen     hallucinations    Current Outpatient  Medications on File Prior to Visit  Medication Sig Dispense Refill  . aspirin EC 81 MG tablet Take 1 tablet (81 mg total) by mouth daily.    Marland Kitchen atorvastatin (LIPITOR) 40 MG tablet Take 1 tablet (40 mg total) by mouth daily. 90 tablet 1  . Cholecalciferol (VITAMIN D3) 5000 UNIT/ML LIQD Take by mouth once a week.    . docusate sodium (COLACE) 100 MG capsule Take 100 mg by mouth 2 (two) times daily as needed.     . Glucosamine HCl 1500 MG TABS Take 1 tablet by mouth daily.    Marland Kitchen glucose blood (ONETOUCH VERIO) test strip Use as instructed to check blood sugar once a day. DX E11.40 100 each 1  . loratadine (CLARITIN) 10 MG tablet Take 10 mg by mouth daily.    . Magnesium 250 MG TABS Take by mouth.    . metFORMIN (GLUCOPHAGE) 500 MG tablet TAKE 2 TABLETS 2 TIMES     DAILY WITH MEALS 360 tablet 1  . Omega-3 Fatty Acids (OMEGA 3 PO) Take 1 capsule by mouth daily.    Marland Kitchen OVER THE COUNTER MEDICATION Take 500 mg by mouth daily. VITAMIN C /  ROSEHIPS    . vitamin E 400 UNIT capsule Take 400 Units by mouth daily as needed.      No current facility-administered medications on file prior to visit.    BP (!) 147/77 (BP Location: Left Arm, Patient Position: Sitting, Cuff Size: Large)   Pulse 82   Temp 98.7 F (37.1 C) (Oral)   Resp 16   Ht 5\' 5"  (1.651 m)   Wt 210 lb (95.3 kg)   SpO2 99%   BMI 34.95 kg/m    Objective:   Physical Exam Constitutional:      Appearance: She is well-developed.  Cardiovascular:     Rate and Rhythm: Normal rate and regular rhythm.     Heart sounds: Normal heart sounds. No murmur heard.   Pulmonary:     Effort: Pulmonary effort is normal. No respiratory distress.     Breath sounds: Normal breath sounds. No wheezing.  Psychiatric:        Behavior: Behavior normal.        Thought Content: Thought content normal.        Judgment: Judgment normal.   GYN: dry atrophic vaginal mucosa.  Benign appearing raised mole noted on right vulva near groin.         Assessment  & Plan:  Vaginitis- suspect atrophic vaginitis.  Swab obtained and will be send for yeast and BV.  She is not sexually active. Given her hx of breast cancer, would not recommend estrogen therapy. We did discuss otc vaginal moisturizers (replens and [place]).   DM2- clinically stable. Continue metformin 500mg  bid.   HTN- bp a bit higher than her usual.  Will continue losartan 50mg  once daily.   Hyperlipidemia- LDL at goal. Continue atorvastatin 40mg  once daily.  This visit occurred during the SARS-CoV-2 public health emergency.  Safety protocols were in place, including screening questions prior to the visit, additional usage of staff PPE, and extensive cleaning of exam room while observing appropriate contact time as indicated for disinfecting solutions.

## 2020-06-06 NOTE — Patient Instructions (Signed)
Please complete lab work prior to leaving.   

## 2020-06-07 LAB — CERVICOVAGINAL ANCILLARY ONLY
Bacterial Vaginitis (gardnerella): NEGATIVE
Candida Glabrata: NEGATIVE
Candida Vaginitis: NEGATIVE
Comment: NEGATIVE
Comment: NEGATIVE
Comment: NEGATIVE

## 2020-06-07 LAB — HEMOGLOBIN A1C
Hgb A1c MFr Bld: 6.2 % of total Hgb — ABNORMAL HIGH (ref <5.7)
Mean Plasma Glucose: 131 (calc)
eAG (mmol/L): 7.3 (calc)

## 2020-06-07 LAB — BASIC METABOLIC PANEL
BUN: 14 mg/dL (ref 7–25)
CO2: 28 mmol/L (ref 20–32)
Calcium: 9.3 mg/dL (ref 8.6–10.4)
Chloride: 101 mmol/L (ref 98–110)
Creat: 0.73 mg/dL (ref 0.50–0.99)
Glucose, Bld: 94 mg/dL (ref 65–99)
Potassium: 4.7 mmol/L (ref 3.5–5.3)
Sodium: 139 mmol/L (ref 135–146)

## 2020-08-29 ENCOUNTER — Other Ambulatory Visit: Payer: Self-pay | Admitting: Family

## 2020-08-29 DIAGNOSIS — Z1231 Encounter for screening mammogram for malignant neoplasm of breast: Secondary | ICD-10-CM

## 2020-09-04 ENCOUNTER — Other Ambulatory Visit: Payer: Self-pay

## 2020-09-05 ENCOUNTER — Other Ambulatory Visit: Payer: Self-pay

## 2020-09-05 ENCOUNTER — Encounter: Payer: Self-pay | Admitting: Family

## 2020-09-05 ENCOUNTER — Ambulatory Visit (INDEPENDENT_AMBULATORY_CARE_PROVIDER_SITE_OTHER): Payer: Medicare Other | Admitting: Family

## 2020-09-05 VITALS — BP 137/81 | HR 80 | Temp 98.7°F | Resp 16 | Ht 65.0 in | Wt 211.0 lb

## 2020-09-05 DIAGNOSIS — E1140 Type 2 diabetes mellitus with diabetic neuropathy, unspecified: Secondary | ICD-10-CM

## 2020-09-05 DIAGNOSIS — I10 Essential (primary) hypertension: Secondary | ICD-10-CM

## 2020-09-05 DIAGNOSIS — IMO0002 Reserved for concepts with insufficient information to code with codable children: Secondary | ICD-10-CM

## 2020-09-05 DIAGNOSIS — E1165 Type 2 diabetes mellitus with hyperglycemia: Secondary | ICD-10-CM

## 2020-09-05 DIAGNOSIS — E785 Hyperlipidemia, unspecified: Secondary | ICD-10-CM | POA: Diagnosis not present

## 2020-09-05 LAB — COMPREHENSIVE METABOLIC PANEL
ALT: 26 U/L (ref 0–35)
AST: 25 U/L (ref 0–37)
Albumin: 4.4 g/dL (ref 3.5–5.2)
Alkaline Phosphatase: 58 U/L (ref 39–117)
BUN: 15 mg/dL (ref 6–23)
CO2: 32 mEq/L (ref 19–32)
Calcium: 9.6 mg/dL (ref 8.4–10.5)
Chloride: 99 mEq/L (ref 96–112)
Creatinine, Ser: 0.74 mg/dL (ref 0.40–1.20)
GFR: 82.37 mL/min (ref >60.00)
Glucose, Bld: 96 mg/dL (ref 70–99)
Potassium: 4.9 mEq/L (ref 3.5–5.1)
Sodium: 137 mEq/L (ref 135–145)
Total Bilirubin: 0.4 mg/dL (ref 0.2–1.2)
Total Protein: 7.4 g/dL (ref 6.0–8.3)

## 2020-09-05 LAB — MICROALBUMIN / CREATININE URINE RATIO
Creatinine,U: 22.6 mg/dL
Microalb Creat Ratio: 3.1 mg/g (ref 0.0–30.0)
Microalb, Ur: <0.7 mg/dL (ref 0.0–1.9)

## 2020-09-05 LAB — LIPID PANEL
Cholesterol: 136 mg/dL (ref 0–200)
HDL: 41.2 mg/dL (ref >39.00)
LDL Cholesterol: 79 mg/dL (ref 0–99)
NonHDL: 94.97
Total CHOL/HDL Ratio: 3
Triglycerides: 81 mg/dL (ref 0.0–149.0)
VLDL: 16.2 mg/dL (ref 0.0–40.0)

## 2020-09-05 LAB — HEMOGLOBIN A1C: Hgb A1c MFr Bld: 6.4 % (ref 4.6–6.5)

## 2020-09-05 NOTE — Patient Instructions (Signed)
Please complete lab work prior to leaving.   

## 2020-09-05 NOTE — Progress Notes (Signed)
Subjective:    Patient ID: Elizabeth Vang, female    DOB: [birth year], 70 y.o.   MRN: [id number]  HPI  Patient is a 70 yr old female who presents today for follow up.  HTN- maintained on losartan 50 mg.   BP Readings from Last 3 Encounters:  09/05/20 137/81  06/06/20 (!) 147/77  01/31/20 121/80   DM2- maintained on metformin 500mg  bid.   Lab Results  Component Value Date   HGBA1C 6.2 (H) 06/06/2020   HGBA1C 6.4 01/31/2020   HGBA1C 6.3 09/22/2019   Lab Results  Component Value Date   MICROALBUR 0.2 12/16/2017   LDLCALC 75 09/22/2019   CREATININE 0.73 06/06/2020   Hyperlipidemia- maintained on atorvastatin 40mg  once daily.  Lab Results  Component Value Date   CHOL 130 09/22/2019   HDL 38.30 (L) 09/22/2019   LDLCALC 75 09/22/2019   TRIG 81.0 09/22/2019   CHOLHDL 3 09/22/2019     Review of Systems See HPI  Past Medical History:  Diagnosis Date  . Arthritis   . Cancer ([place]) 12/12/2000   breast  . Diabetes ([place])   . Environmental allergies   . GERD (gastroesophageal reflux disease)   . Hyperlipidemia   . Hypertension   . Neuropathy    fingers and toes  . Osteoporosis      Social History   Socioeconomic History  . Marital status: Married    Spouse name: Not on file  . Number of children: Not on file  . Years of education: Not on file  . Highest education level: Not on file  Occupational History  . Not on file  Tobacco Use  . Smoking status: Never Smoker  . Smokeless tobacco: Never Used  Substance and Sexual Activity  . Alcohol use: Yes    Alcohol/week: 0.0 standard drinks    Comment: rarely drinks wine  . Drug use: Not on file  . Sexual activity: Not on file  Other Topics Concern  . 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 APPENDECTOMY  1982  . BREAST LUMPECTOMY Right 12/12/00   lumpectomy / axillary node dissection, sentinel node bx (cancer)  . GANGLION CYST EXCISION Right 1970   wrist  . LAPAROSCOPIC SALPINGO OOPHERECTOMY  1984   benign tumor    Family History  Problem Relation Age of Onset  . Hypertension Mother   . Stroke Mother   . Cancer Mother 31       uterine  . Heart disease Mother        CHF  . Lung disease Mother        interstital lung fibrosis  . Diabetes Father   . Lymphoma Father   . Colon cancer Paternal Grandfather 69  . Hypertension Brother   . Diabetes type II Brother   . Hypertension Sister        2 sisters  . Diabetes Mellitus II Sister  2 sisters    Allergies  Allergen Reactions  . Oxycodone-Acetaminophen     hallucinations    Current Outpatient Medications on File Prior to Visit  Medication Sig Dispense Refill  . aspirin EC 81 MG tablet Take 1 tablet (81 mg total) by mouth daily.    Marland Kitchen atorvastatin (LIPITOR) 40 MG tablet Take 1 tablet (40 mg total) by mouth daily. 90 tablet 1  . Cholecalciferol (VITAMIN D3) 5000 UNIT/ML LIQD Take by mouth once a week.    . docusate sodium (COLACE) 100 MG capsule Take 100 mg by mouth 2 (two) times daily as needed.     . Glucosamine HCl 1500 MG TABS Take 1 tablet by mouth daily.    Marland Kitchen glucose blood (ONETOUCH VERIO) test strip Use as instructed to check blood sugar once a day. DX E11.40 100 each 1  . loratadine (CLARITIN) 10 MG tablet Take 10 mg by mouth daily.    Marland Kitchen losartan (COZAAR) 50 MG tablet Take 1 tablet (50 mg total) by mouth daily. 90 tablet 1  . Magnesium 250 MG TABS Take by mouth.    . metFORMIN (GLUCOPHAGE) 500 MG tablet TAKE 2 TABLETS 2 TIMES      DAILY WITH MEALS 360 tablet 1  . Omega-3 Fatty Acids (OMEGA 3 PO) Take 1 capsule by mouth daily.    Marland Kitchen omeprazole (PRILOSEC) 40 MG capsule Take 1 capsule (40 mg total) by mouth daily. 90 capsule 1  . OVER THE COUNTER MEDICATION Take 500 mg by mouth daily. VITAMIN C /  ROSEHIPS    . vitamin E 400 UNIT capsule Take 400 Units by mouth daily as needed.      No current facility-administered medications on file prior to visit.    BP 137/81 (BP Location: Left Arm, Patient Position: Sitting, Cuff Size: Large)   Pulse 80   Temp 98.7 F (37.1 C) (Oral)   Resp 16   Ht 5\' 5"  (1.651 m)   Wt 211 lb (95.7 kg)   SpO2 100%   BMI 35.11 kg/m       Objective:   Physical Exam Constitutional:      Appearance: She is well-developed and well-nourished.  Cardiovascular:     Rate and Rhythm: Normal rate and regular rhythm.     Heart sounds: Normal heart sounds. No murmur heard.   Pulmonary:     Effort: Pulmonary effort is normal. No respiratory distress.     Breath sounds: Normal breath sounds. No wheezing.  Psychiatric:        Mood and Affect: Mood and affect normal.        Behavior: Behavior normal.        Thought Content: Thought content normal.        Judgment: Judgment normal.           Assessment & Plan:  HTN- BP stable. Continue losartan 50mg . Obtain follow up cmet.  Hyperlipidemia- tolerating atovastatin 40mg . Obtain follow up lipid panel.  DM2- continues to work on DM diet. Last A1C was excellent at 6.2.  Will obtain A1C and CMET, urine microalbumin.   This visit occurred during the SARS-CoV-2 public health emergency.  Safety protocols were in place, including screening questions prior to the visit, additional usage of staff PPE, and extensive cleaning of exam room while observing appropriate contact time as indicated for disinfecting solutions.

## 2020-09-14 ENCOUNTER — Other Ambulatory Visit: Payer: Self-pay | Admitting: Family

## 2020-10-05 ENCOUNTER — Other Ambulatory Visit: Payer: Self-pay

## 2020-10-05 ENCOUNTER — Ambulatory Visit
Admission: RE | Admit: 2020-10-05 | Discharge: 2020-10-05 | Disposition: A | Discharge status: Home / Self Care | Payer: Medicare Other | Source: Ambulatory Visit | Attending: Family | Admitting: Family

## 2020-10-05 DIAGNOSIS — Z1231 Encounter for screening mammogram for malignant neoplasm of breast: Secondary | ICD-10-CM | POA: Diagnosis not present

## 2020-10-05 HISTORY — DX: Malignant neoplasm of unspecified site of unspecified female breast: C50.919

## 2021-01-09 ENCOUNTER — Other Ambulatory Visit: Payer: Self-pay

## 2021-01-09 ENCOUNTER — Ambulatory Visit (INDEPENDENT_AMBULATORY_CARE_PROVIDER_SITE_OTHER): Payer: Medicare Other | Admitting: Family

## 2021-01-09 VITALS — BP 144/78 | HR 77 | Temp 98.9°F | Resp 16 | Ht 65.0 in | Wt 214.0 lb

## 2021-01-09 DIAGNOSIS — E118 Type 2 diabetes mellitus with unspecified complications: Secondary | ICD-10-CM | POA: Diagnosis not present

## 2021-01-09 DIAGNOSIS — E785 Hyperlipidemia, unspecified: Secondary | ICD-10-CM | POA: Diagnosis not present

## 2021-01-09 DIAGNOSIS — E1140 Type 2 diabetes mellitus with diabetic neuropathy, unspecified: Secondary | ICD-10-CM | POA: Diagnosis not present

## 2021-01-09 DIAGNOSIS — E1165 Type 2 diabetes mellitus with hyperglycemia: Secondary | ICD-10-CM

## 2021-01-09 DIAGNOSIS — I10 Essential (primary) hypertension: Secondary | ICD-10-CM | POA: Diagnosis not present

## 2021-01-09 DIAGNOSIS — K219 Gastro-esophageal reflux disease without esophagitis: Secondary | ICD-10-CM | POA: Diagnosis not present

## 2021-01-09 DIAGNOSIS — IMO0002 Reserved for concepts with insufficient information to code with codable children: Secondary | ICD-10-CM

## 2021-01-09 LAB — HEMOGLOBIN A1C: Hgb A1c MFr Bld: 6.6 % — ABNORMAL HIGH (ref 4.6–6.5)

## 2021-01-09 LAB — BASIC METABOLIC PANEL
BUN: 13 mg/dL (ref 6–23)
CO2: 31 mEq/L (ref 19–32)
Calcium: 9.1 mg/dL (ref 8.4–10.5)
Chloride: 102 mEq/L (ref 96–112)
Creatinine, Ser: 0.71 mg/dL (ref 0.40–1.20)
GFR: 86.35 mL/min (ref >60.00)
Glucose, Bld: 101 mg/dL — ABNORMAL HIGH (ref 70–99)
Potassium: 4.6 mEq/L (ref 3.5–5.1)
Sodium: 140 mEq/L (ref 135–145)

## 2021-01-09 MED ORDER — METFORMIN HCL 500 MG PO TABS: 500.0000 mg | ORAL_TABLET | Freq: Two times a day (BID) | ORAL | 0 refills | Status: DC

## 2021-01-09 MED ORDER — LOSARTAN POTASSIUM 50 MG PO TABS: 50.0000 mg | ORAL_TABLET | Freq: Every day | ORAL | 1 refills | Status: DC

## 2021-01-09 MED ORDER — OMEPRAZOLE 40 MG PO CPDR: 40.0000 mg | DELAYED_RELEASE_CAPSULE | Freq: Every day | ORAL | 1 refills | Status: DC

## 2021-01-09 NOTE — Assessment & Plan Note (Signed)
LDL at goal, continue atorvastatin 40mg  once daily.

## 2021-01-09 NOTE — Assessment & Plan Note (Signed)
BP stable on losartan 50mg  once daily. Continue same.

## 2021-01-09 NOTE — Assessment & Plan Note (Signed)
Stable. Continues to require daily omeprazole 40mg . Continue same.

## 2021-01-09 NOTE — Progress Notes (Signed)
Subjective:    Patient ID: Edward Qualia, female    DOB: [birth year], 70 y.o.   MRN: [id number]  HPI  Patient is a 70 yr old female who presents today for follow up.  DM2- maintained on metformin 500 mg bid. Reports that she is doing "ok" with diet.  Has been eating more fast food lately.    Lab Results  Component Value Date   HGBA1C 6.4 09/05/2020   HGBA1C 6.2 (H) 06/06/2020   HGBA1C 6.4 01/31/2020   Lab Results  Component Value Date   MICROALBUR <0.7 09/05/2020   LDLCALC 79 09/05/2020   CREATININE 0.74 09/05/2020   Hyperlipidemia- maintained on atorvastatin 40mg  once daily. Denies myalgia.  Lab Results  Component Value Date   CHOL 136 09/05/2020   HDL 41.20 09/05/2020   LDLCALC 79 09/05/2020   TRIG 81.0 09/05/2020   CHOLHDL 3 09/05/2020   HTN- maintained on losartan 50mg . Denies LE edema  BP Readings from Last 3 Encounters:  01/09/21 (!) 144/78  09/05/20 137/81  06/06/20 (!) 147/77    GERD- maintained on omeprazole 40mg  once daily- uses most days.  Has recurrence of symptoms if she misses doses.   Review of Systems    see HPI  Past Medical History:  Diagnosis Date  . Arthritis   . Breast cancer ([place])   . Cancer ([place]) 12/12/2000   breast  . Diabetes ([place])   . Environmental allergies   . GERD (gastroesophageal reflux disease)   . Hyperlipidemia   . Hypertension   . Neuropathy    fingers and toes  . Osteoporosis      Social History   Socioeconomic History  . Marital status: Married    Spouse name: Not on file  . Number of children: Not on file  . Years of education: Not on file  . Highest education level: Not on file  Occupational History  . Not on file  Tobacco Use  . Smoking status: Never Smoker  . Smokeless tobacco: Never Used  Substance and Sexual Activity  . Alcohol use: Yes    Alcohol/week: 0.0 standard drinks    Comment: rarely drinks wine  . Drug use: Not on file  . Sexual activity: Not on file  Other Topics Concern  .  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 APPENDECTOMY  1982  . BREAST LUMPECTOMY Right 12/12/00   lumpectomy / axillary node dissection, sentinel node bx (cancer)  . GANGLION CYST EXCISION Right 1970   wrist  . LAPAROSCOPIC SALPINGO OOPHERECTOMY  1984   benign tumor    Family History  Problem Relation Age of Onset  . Hypertension Mother   . Stroke Mother   . Cancer Mother 35       uterine  . Heart disease Mother        CHF  . Lung disease Mother        interstital lung fibrosis  . Diabetes Father   .  Lymphoma Father   . Colon cancer Paternal Grandfather 34  . Hypertension Brother   . Diabetes type II Brother   . Hypertension Sister        2 sisters  . Diabetes Mellitus II Sister        2 sisters  . Breast cancer Sister     Allergies  Allergen Reactions  . Oxycodone-Acetaminophen     hallucinations    Current Outpatient Medications on File Prior to Visit  Medication Sig Dispense Refill  . aspirin EC 81 MG tablet Take 1 tablet (81 mg total) by mouth daily.    Marland Kitchen atorvastatin (LIPITOR) 40 MG tablet TAKE 1 TABLET DAILY 90 tablet 1  . Cholecalciferol (VITAMIN D3) 5000 UNIT/ML LIQD Take by mouth once a week.    . docusate sodium (COLACE) 100 MG capsule Take 100 mg by mouth 2 (two) times daily as needed.     . Glucosamine HCl 1500 MG TABS Take 1 tablet by mouth daily.    Marland Kitchen glucose blood (ONETOUCH VERIO) test strip Use as instructed to check blood sugar once a day. DX E11.40 100 each 1  .  loratadine (CLARITIN) 10 MG tablet Take 10 mg by mouth daily.    Marland Kitchen losartan (COZAAR) 50 MG tablet Take 1 tablet (50 mg total) by mouth daily. 90 tablet 1  . Magnesium 250 MG TABS Take by mouth.    . metFORMIN (GLUCOPHAGE) 500 MG tablet TAKE 2 TABLETS 2 TIMES     DAILY WITH MEALS 360 tablet 1  . Omega-3 Fatty Acids (OMEGA 3 PO) Take 1 capsule by mouth daily.    Marland Kitchen omeprazole (PRILOSEC) 40 MG capsule Take 1 capsule (40 mg total) by mouth daily. 90 capsule 1  . OVER THE COUNTER MEDICATION Take 500 mg by mouth daily. VITAMIN C /  ROSEHIPS    . vitamin E 400 UNIT capsule Take 400 Units by mouth daily as needed.      No current facility-administered medications on file prior to visit.    BP (!) 144/78 (BP Location: Left Arm, Patient Position: Sitting, Cuff Size: Large)   Pulse 77   Temp 98.9 F (37.2 C) (Oral)   Resp 16   Ht 5\' 5"  (1.651 m)   Wt 214 lb (97.1 kg)   SpO2 100%   BMI 35.61 kg/m     Objective:   Physical Exam Constitutional:      Appearance: She is well-developed.  Neck:     Thyroid: No thyromegaly.  Cardiovascular:     Rate and Rhythm: Normal rate and regular rhythm.     Heart sounds: Normal heart sounds. No murmur heard.   Pulmonary:     Effort: Pulmonary effort is normal. No respiratory distress.     Breath sounds: Normal breath sounds. No wheezing.  Musculoskeletal:     Cervical back: Neck supple.  Skin:    General: Skin is warm and dry.  Neurological:     Mental Status: She is alert and oriented to person, place, and time.  Psychiatric:        Behavior: Behavior normal.        Thought Content: Thought content normal.        Judgment: Judgment normal.           Assessment & Plan:

## 2021-01-09 NOTE — Patient Instructions (Signed)
Please complete lab work prior to leaving.   

## 2021-01-09 NOTE — Assessment & Plan Note (Signed)
Stable on metformin 500mg  bid. Obtain follow up a1c. Discussed diet.

## 2021-02-12 ENCOUNTER — Ambulatory Visit (INDEPENDENT_AMBULATORY_CARE_PROVIDER_SITE_OTHER): Payer: Medicare Other | Admitting: Family Medicine

## 2021-02-12 ENCOUNTER — Other Ambulatory Visit: Payer: Self-pay

## 2021-02-12 ENCOUNTER — Encounter: Payer: Self-pay | Admitting: Family Medicine

## 2021-02-12 VITALS — BP 140/90 | HR 98 | Temp 97.6°F | Resp 16 | Ht 65.0 in | Wt 208.0 lb

## 2021-02-12 DIAGNOSIS — N61 Mastitis without abscess: Secondary | ICD-10-CM

## 2021-02-12 MED ORDER — AMOXICILLIN-POT CLAVULANATE 875-125 MG PO TABS: 1.0000 | ORAL_TABLET | Freq: Two times a day (BID) | ORAL | 0 refills | Status: DC

## 2021-02-12 MED ORDER — CEFTRIAXONE SODIUM 1 G IJ SOLR
1.0000 g | Freq: Once | INTRAMUSCULAR | Status: AC
  Administered 2021-02-12: 1 g via INTRAMUSCULAR

## 2021-02-12 NOTE — Patient Instructions (Addendum)
It was very nice to see you today- I am sorry you have this infection!   Treat with - rocephin shot today Start on Augmentin this evening, take twice a day for 10 days  Continue ibuprofen or Tylenol as needed for pain and fever. Cool compress may help too   If you are getting worse contact me right away, otherwise please let me know if not significantly improved within 36 hours

## 2021-02-12 NOTE — Progress Notes (Signed)
[place] at [facility] [address] [place], [place] [address] [phone]  Date:  02/12/2021   Name:  Elizabeth Vang   DOB:  1951/01/09   MRN:  [id number]  PCP:  [name], NP    Chief Complaint: Breast Pain (Right breast pain, started Saturday, no nipple discharge)   History of Present Illness:  Elizabeth Vang is a 70 y.o. very pleasant female patient who presents with the following:  Primary patient of my partner [name], history of diabetes, hypertension, hyperlipidemia, breast cancer I have not seen her myself previously  Here today with concern of possible breast infection She notes pain in her right breast- started 2 nights ago She is not aware of any cause - the tenderness has increased.  Yesterday am she noted redness And she felt feverish last night as well  started taking tylenol- did not happen to check her temp however  No other symptoms noted- no cough, vomiting etc - she generally feels ok  She had a similar issue 8- 10 years ago- occurred several times and was successfully treated with abx   RIGHT Breast cancer details- dx in 2002 She had chemo, right lumpectomy and nodes,radiation, then took arimidex for a year or so but had to change to tamoxifen for 5 years  Then back to arimidex for 5 years   She is on routine mammo- UTD    Lab Results  Component Value Date   HGBA1C 6.6 (H) 01/09/2021     Patient Active Problem List   Diagnosis Date Noted   Allergic rhinitis 05/05/2017   HTN (hypertension) 06/15/2013   Routine general medical examination at a health care facility 06/15/2013   Other dysphagia 06/07/2013   GERD (gastroesophageal reflux disease) 06/07/2013   Osteoarthritis 06/07/2013   Breast cancer ([place]) 03/12/2012   Controlled diabetes mellitus type 2 with complications ([place]) 29/52/8413   Hyperlipidemia 05/02/2009    Past Medical History:  Diagnosis Date    Arthritis    Breast cancer ([place])    Cancer ([place]) 12/12/2000   breast   Diabetes ([place])    Environmental allergies    GERD (gastroesophageal reflux disease)    Hyperlipidemia    Hypertension    Neuropathy    fingers and toes   Osteoporosis     Past Surgical History:  Procedure Laterality Date   APPENDECTOMY  1982   BREAST LUMPECTOMY Right 12/12/00   lumpectomy / axillary node dissection, sentinel node bx (cancer)   GANGLION CYST EXCISION Right 1970   wrist   LAPAROSCOPIC SALPINGO OOPHERECTOMY  1984   benign tumor    Social History   Tobacco Use   Smoking status: Never   Smokeless tobacco: Never  Substance Use Topics   Alcohol use: Yes    Alcohol/week: 0.0 standard drinks    Comment: rarely drinks wine    Family History  Problem Relation Age of Onset   Hypertension Mother    Stroke Mother    Cancer Mother 28       uterine   Heart disease Mother        CHF   Lung disease Mother        interstital lung fibrosis   Diabetes Father    Lymphoma Father    Colon cancer Paternal Grandfather 6   Hypertension Brother    Diabetes type II Brother    Hypertension Sister        2 sisters  Diabetes Mellitus II Sister        2 sisters   Breast cancer Sister     Allergies  Allergen Reactions   Oxycodone-Acetaminophen     hallucinations    Medication list has been reviewed and updated.  Current Outpatient Medications on File Prior to Visit  Medication Sig Dispense Refill   aspirin EC 81 MG tablet Take 1 tablet (81 mg total) by mouth daily.     atorvastatin (LIPITOR) 40 MG tablet TAKE 1 TABLET DAILY 90 tablet 1   Cholecalciferol (VITAMIN D3) 5000 UNIT/ML LIQD Take by mouth once a week.     docusate sodium (COLACE) 100 MG capsule Take 100 mg by mouth 2 (two) times daily as needed.      Glucosamine HCl 1500 MG TABS Take 1 tablet by mouth daily.     glucose blood (ONETOUCH VERIO) test strip Use as instructed to check blood sugar once a day. DX E11.40 100 each 1    loratadine (CLARITIN) 10 MG tablet Take 10 mg by mouth daily.     losartan (COZAAR) 50 MG tablet Take 1 tablet (50 mg total) by mouth daily. 90 tablet 1   Magnesium 250 MG TABS Take by mouth.     metFORMIN (GLUCOPHAGE) 500 MG tablet Take 1 tablet (500 mg total) by mouth 2 (two) times daily with a meal. 360 tablet 0   Omega-3 Fatty Acids (OMEGA 3 PO) Take 1 capsule by mouth daily.     omeprazole (PRILOSEC) 40 MG capsule Take 1 capsule (40 mg total) by mouth daily. 90 capsule 1   OVER THE COUNTER MEDICATION Take 500 mg by mouth daily. VITAMIN C /  ROSEHIPS     vitamin E 400 UNIT capsule Take 400 Units by mouth daily as needed.      No current facility-administered medications on file prior to visit.    Review of Systems:  As per HPI- otherwise negative.    Physical Examination: Vitals:   02/12/21 1008  BP: 140/90  Pulse: 98  Resp: 16  Temp: 97.6 F (36.4 C)  SpO2: 95%   Vitals:   02/12/21 1008  Weight: 208 lb (94.3 kg)  Height: 5\' 5"  (1.651 m)   Body mass index is 34.61 kg/m. Ideal Body Weight: Weight in (lb) to have BMI = 25: 149.9  GEN: no acute distress. Overweight, looks well  HEENT: Atraumatic, Normocephalic.  Ears and Nose: No external deformity. CV: RRR, No M/G/R. No JVD. No thrill. No extra heart sounds. PULM: CTA B, no wheezes, crackles, rhonchi. No retractions. No resp. distress. No accessory muscle use. ABD: S, NT, ND. No rebound. No HSM. EXTR: No c/c/e PSYCH: Normally interactive. Conversant.  Diffuse redness of the right breast, involving the entire breast.  Post op breast changes evident No discharge or weeping      Assessment and Plan: Cellulitis of right breast - Plan: cefTRIAXone (ROCEPHIN) injection 1 g, amoxicillin-clavulanate (AUGMENTIN) 875-125 MG tablet  Pt here today with recurrent cellulitis of the right breast.  She is non toxic and afebrile.  Last dose of tylenol about 4 hours ago Rocephin IM Augmentin Close follow-up- she will contact  me if not improving within 36 horse, right away if worse Asked her to start checking her temp as well  This visit occurred during the SARS-CoV-2 public health emergency.  Safety protocols were in place, including screening questions prior to the visit, additional usage of staff PPE, and extensive cleaning of exam room while observing appropriate contact  time as indicated for disinfecting solutions.   Signed Lamar Blinks, MD

## 2021-03-05 ENCOUNTER — Other Ambulatory Visit: Payer: Self-pay | Admitting: Family

## 2021-03-12 ENCOUNTER — Other Ambulatory Visit: Payer: Self-pay | Admitting: Family

## 2021-03-12 NOTE — Telephone Encounter (Signed)
Pharmacy note:    Please verify the current dose and directions (2 prescriptions on file with different directions). Please respond with intended directions or comment to Pharmacy.  Please verify the current dose and directions (2 prescriptions on file with different directions). Please respond with intended directions or comment to Pharmacy.

## 2021-03-15 ENCOUNTER — Other Ambulatory Visit: Payer: Self-pay

## 2021-03-15 MED ORDER — METFORMIN HCL 500 MG PO TABS: ORAL_TABLET | ORAL | 1 refills | Status: DC

## 2021-04-02 DIAGNOSIS — H04123 Dry eye syndrome of bilateral lacrimal glands: Secondary | ICD-10-CM | POA: Diagnosis not present

## 2021-04-02 DIAGNOSIS — H2513 Age-related nuclear cataract, bilateral: Secondary | ICD-10-CM | POA: Diagnosis not present

## 2021-04-02 DIAGNOSIS — H35363 Drusen (degenerative) of macula, bilateral: Secondary | ICD-10-CM | POA: Diagnosis not present

## 2021-04-02 DIAGNOSIS — E119 Type 2 diabetes mellitus without complications: Secondary | ICD-10-CM | POA: Diagnosis not present

## 2021-04-02 LAB — HM DIABETES EYE EXAM

## 2021-05-15 ENCOUNTER — Ambulatory Visit (INDEPENDENT_AMBULATORY_CARE_PROVIDER_SITE_OTHER): Payer: Medicare Other | Admitting: Family

## 2021-05-15 ENCOUNTER — Other Ambulatory Visit: Payer: Self-pay

## 2021-05-15 VITALS — BP 150/78 | HR 80 | Temp 98.5°F | Resp 16 | Wt 205.0 lb

## 2021-05-15 DIAGNOSIS — E785 Hyperlipidemia, unspecified: Secondary | ICD-10-CM

## 2021-05-15 DIAGNOSIS — R1319 Other dysphagia: Secondary | ICD-10-CM

## 2021-05-15 DIAGNOSIS — K219 Gastro-esophageal reflux disease without esophagitis: Secondary | ICD-10-CM | POA: Diagnosis not present

## 2021-05-15 DIAGNOSIS — Z23 Encounter for immunization: Secondary | ICD-10-CM | POA: Diagnosis not present

## 2021-05-15 DIAGNOSIS — I10 Essential (primary) hypertension: Secondary | ICD-10-CM | POA: Diagnosis not present

## 2021-05-15 DIAGNOSIS — E118 Type 2 diabetes mellitus with unspecified complications: Secondary | ICD-10-CM

## 2021-05-15 LAB — BASIC METABOLIC PANEL
BUN: 19 mg/dL (ref 6–23)
CO2: 30 mEq/L (ref 19–32)
Calcium: 9.5 mg/dL (ref 8.4–10.5)
Chloride: 101 mEq/L (ref 96–112)
Creatinine, Ser: 0.77 mg/dL (ref 0.40–1.20)
GFR: 78.15 mL/min (ref >60.00)
Glucose, Bld: 92 mg/dL (ref 70–99)
Potassium: 4.8 mEq/L (ref 3.5–5.1)
Sodium: 138 mEq/L (ref 135–145)

## 2021-05-15 LAB — HEMOGLOBIN A1C: Hgb A1c MFr Bld: 6.4 % (ref 4.6–6.5)

## 2021-05-15 NOTE — Assessment & Plan Note (Addendum)
BP Readings from Last 3 Encounters:  05/15/21 (!) 150/78  02/12/21 140/90  01/09/21 (!) 144/78   BP above goal. Recommened increase in losartan from 50mg  to 100mg . Pt declines.

## 2021-05-15 NOTE — Assessment & Plan Note (Signed)
Recommended referral to GI. Pt declines referral to GI for dysphagia. Continue PPI.

## 2021-05-15 NOTE — Assessment & Plan Note (Signed)
Lab Results  Component Value Date   HGBA1C 6.6 (H) 01/09/2021   HGBA1C 6.4 09/05/2020   HGBA1C 6.2 (H) 06/06/2020   Lab Results  Component Value Date   MICROALBUR <0.7 09/05/2020   LDLCALC 79 09/05/2020   CREATININE 0.71 01/09/2021

## 2021-05-15 NOTE — Assessment & Plan Note (Addendum)
Reports stable on omeprazole 40 mg. Continue same.

## 2021-05-15 NOTE — Progress Notes (Signed)
Subjective:   By signing my name below, I, Elizabeth Vang, attest that this documentation has been prepared under the direction and in the presence of [name], NP, 05/15/2021   Patient ID: Elizabeth Vang, female    DOB: January 27, 1951, 70 y.o.   MRN: [id number]  Chief Complaint  Patient presents with   Diabetes    Here for follow up   Hypertension    Here for follow up    HPI Patient is in today for an office visit.  Diabetes: She is following up regarding her diabetes. Her last A1C in May 2022 was looking better than previous readings. She will need to have this checked again today. She is compliant with taking her 1000 mg Metformin. Lab Results  Component Value Date   HGBA1C 6.6 (H) 01/09/2021    Reflux: She is currently taking 40 mg omeprazole to help with her reflux.She complains that she is unable to eat first thing in the morning due to the feeling like her food is stuck.  Blood Pressure: Her blood pressure is high today during her visit. She is currently compliant with taking her 50 mg losartan to manage her blood pressure. BP Readings from Last 3 Encounters:  05/15/21 (!) 150/78  02/12/21 140/90  01/09/21 (!) 144/78  High Cholesterol: During her last visit her lipid panel revealed regular findings.  Lab Results  Component Value Date   CHOL 136 09/05/2020   HDL 41.20 09/05/2020   LDLCALC 79 09/05/2020   TRIG 81.0 09/05/2020   CHOLHDL 3 09/05/2020  Vaccinations: She is interested in receiving her flu shot during this visit. She would like ot receive her Shingrix vaccine at a later date.   Health Maintenance Due  Topic Date Due   Zoster Vaccines- Shingrix (1 of 2) Never done   COVID-19 Vaccine (5 - Booster for [employer] series) 03/30/2021    Past Medical History:  Diagnosis Date   Arthritis    Breast cancer ([place])    Cancer ([place]) 12/12/2000   breast   Diabetes ([place])    Environmental allergies    GERD (gastroesophageal reflux disease)     Hyperlipidemia    Hypertension    Neuropathy    fingers and toes   Osteoporosis     Past Surgical History:  Procedure Laterality Date   APPENDECTOMY  1982   BREAST LUMPECTOMY Right 12/12/00   lumpectomy / axillary node dissection, sentinel node bx (cancer)   GANGLION CYST EXCISION Right 1970   wrist   LAPAROSCOPIC SALPINGO OOPHERECTOMY  1984   benign tumor    Family History  Problem Relation Age of Onset   Hypertension Mother    Stroke Mother    Cancer Mother 68       uterine   Heart disease Mother        CHF   Lung disease Mother        interstital lung fibrosis   Diabetes Father    Lymphoma Father    Colon cancer Paternal Grandfather 38   Hypertension Brother    Diabetes type II Brother    Hypertension Sister        2 sisters   Diabetes Mellitus II Sister        2 sisters   Breast cancer Sister     Social History   Socioeconomic History   Marital status: Married    Spouse name: Not on file   Number of children: Not on file   Years of education: Not  on file   Highest education level: Not on file  Occupational History   Not on file  Tobacco Use   Smoking status: Never   Smokeless tobacco: Never  Substance and Sexual Activity   Alcohol use: Yes    Alcohol/week: 0.0 standard drinks    Comment: rarely drinks wine   Drug use: Not on file   Sexual activity: Not on file  Other Topics Concern   Not on file  Social History Narrative   Lives with her husband and elderly mother   Has 2 sons- live locally, no grandchildren   Enjoys reading, travelling   Therapist, 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     atorvastatin (LIPITOR) 40 MG tablet TAKE 1 TABLET DAILY 90 tablet 1   Cholecalciferol (VITAMIN D3) 5000 UNIT/ML LIQD Take by mouth once a week.     docusate sodium (COLACE) 100 MG capsule Take 100 mg by mouth 2 (two) times daily as needed.      Glucosamine HCl 1500 MG TABS Take 1 tablet by mouth daily.     glucose blood (ONETOUCH VERIO) test strip Use as instructed to check blood sugar once a day. DX E11.40 100 each 1   loratadine (CLARITIN) 10 MG tablet Take 10 mg by mouth daily.     losartan (COZAAR) 50 MG tablet Take 1 tablet (50 mg total) by mouth daily. 90 tablet 1   Magnesium 250 MG TABS Take by mouth.     metFORMIN (GLUCOPHAGE) 500 MG tablet TAKE 2 TABLETS 2 TIMES     DAILY WITH MEALS 360 tablet 1   Omega-3 Fatty Acids (OMEGA 3 PO) Take 1 capsule by mouth daily.     omeprazole (PRILOSEC) 40 MG capsule Take 1 capsule (40 mg total) by mouth daily. 90 capsule 1   OVER THE COUNTER MEDICATION Take 500 mg by mouth daily. VITAMIN C /  ROSEHIPS     vitamin E 400 UNIT capsule Take 400 Units by mouth daily as needed.      amoxicillin-clavulanate (AUGMENTIN) 875-125 MG tablet Take 1 tablet by mouth 2 (two) times daily. 20 tablet 0   No facility-administered medications prior to visit.    Allergies  Allergen Reactions   Oxycodone-Acetaminophen     hallucinations    ROS See HPI    Objective:    Physical Exam Constitutional:      General: She is not in acute distress.    Appearance: Normal appearance. She is not ill-appearing.  HENT:     Head: Normocephalic and atraumatic.     Right Ear: External ear normal.     Left Ear: External ear normal.  Eyes:     Extraocular Movements: Extraocular movements intact.  Cardiovascular:     Rate and Rhythm: Normal rate and regular rhythm.     Heart sounds: Normal heart sounds. No murmur heard.   No gallop.  Pulmonary:     Effort: Pulmonary effort is normal. No respiratory distress.     Breath  sounds: Normal breath sounds.  No wheezing or rales.  Neurological:     Mental Status: She is alert and oriented to person, place, and time.  Psychiatric:        Behavior: Behavior normal.        Judgment: Judgment normal.    BP (!) 150/78 (BP Location: Right Arm, Patient Position: Sitting, Cuff Size: Large)   Pulse 80   Temp 98.5 F (36.9 C) (Oral)   Resp 16   Wt 205 lb (93 kg)   SpO2 99%   BMI 34.11 kg/m  Wt Readings from Last 3 Encounters:  05/15/21 205 lb (93 kg)  02/12/21 208 lb (94.3 kg)  01/09/21 214 lb (97.1 kg)       Assessment & Plan:   Problem List Items Addressed This Visit       Unprioritized   Other dysphagia    Recommended referral to GI. Pt declines referral to GI for dysphagia. Continue PPI.      Hyperlipidemia    Lab Results  Component Value Date   CHOL 136 09/05/2020   HDL 41.20 09/05/2020   LDLCALC 79 09/05/2020   TRIG 81.0 09/05/2020   CHOLHDL 3 09/05/2020  LDL at goal. Continue atorvastatin 40mg .       HTN (hypertension)    BP Readings from Last 3 Encounters:  05/15/21 (!) 150/78  02/12/21 140/90  01/09/21 (!) 144/78  BP above goal. Recommened increase in losartan from 50mg  to 100mg . Pt declines.          GERD (gastroesophageal reflux disease)    Reports stable on omeprazole 40 mg. Continue same.        Controlled diabetes mellitus type 2 with complications [facility])    Lab Results  Component Value Date   HGBA1C 6.6 (H) 01/09/2021   HGBA1C 6.4 09/05/2020   HGBA1C 6.2 (H) 06/06/2020   Lab Results  Component Value Date   MICROALBUR <0.7 09/05/2020   LDLCALC 79 09/05/2020   CREATININE 0.71 01/09/2021        Relevant Orders   Hemoglobin H3Z   Basic metabolic panel   Other Visit Diagnoses     Needs flu shot    -  Primary   Relevant Orders   Flu Vaccine QUAD High Dose(Fluad) (Completed)       No orders of the defined types were placed in this encounter.   I, [name], NP, personally preformed the services  described in this documentation.  All medical record entries made by the scribe were at my direction and in my presence.  I have reviewed the chart and discharge instructions (if applicable) and agree that the record reflects my personal performance and is accurate and complete. 05/15/2021   I,Elizabeth Vang,acting as a Education administrator for Nance Pear, NP.,have documented all relevant documentation on the behalf of Nance Pear, NP,as directed by  Nance Pear, NP while in the presence of Nance Pear, NP.    Nance Pear, NP

## 2021-05-15 NOTE — Assessment & Plan Note (Addendum)
Lab Results  Component Value Date   CHOL 136 09/05/2020   HDL 41.20 09/05/2020   LDLCALC 79 09/05/2020   TRIG 81.0 09/05/2020   CHOLHDL 3 09/05/2020   LDL at goal. Continue atorvastatin 40mg .

## 2021-07-26 ENCOUNTER — Other Ambulatory Visit: Payer: Self-pay | Admitting: Family

## 2021-08-16 ENCOUNTER — Other Ambulatory Visit: Payer: Self-pay | Admitting: Family

## 2021-09-03 ENCOUNTER — Other Ambulatory Visit: Payer: Self-pay | Admitting: Family

## 2021-09-17 ENCOUNTER — Ambulatory Visit: Payer: Medicare Other | Admitting: Family

## 2021-09-24 ENCOUNTER — Ambulatory Visit (INDEPENDENT_AMBULATORY_CARE_PROVIDER_SITE_OTHER): Payer: Medicare Other | Admitting: Family

## 2021-09-24 VITALS — BP 136/83 | HR 83 | Temp 98.8°F | Resp 16 | Wt 201.0 lb

## 2021-09-24 DIAGNOSIS — E559 Vitamin D deficiency, unspecified: Secondary | ICD-10-CM

## 2021-09-24 DIAGNOSIS — E118 Type 2 diabetes mellitus with unspecified complications: Secondary | ICD-10-CM

## 2021-09-24 DIAGNOSIS — K219 Gastro-esophageal reflux disease without esophagitis: Secondary | ICD-10-CM

## 2021-09-24 DIAGNOSIS — I10 Essential (primary) hypertension: Secondary | ICD-10-CM

## 2021-09-24 DIAGNOSIS — E785 Hyperlipidemia, unspecified: Secondary | ICD-10-CM | POA: Diagnosis not present

## 2021-09-24 LAB — COMPREHENSIVE METABOLIC PANEL
ALT: 31 U/L (ref 0–35)
AST: 30 U/L (ref 0–37)
Albumin: 4.3 g/dL (ref 3.5–5.2)
Alkaline Phosphatase: 55 U/L (ref 39–117)
BUN: 20 mg/dL (ref 6–23)
CO2: 30 mEq/L (ref 19–32)
Calcium: 9.6 mg/dL (ref 8.4–10.5)
Chloride: 100 mEq/L (ref 96–112)
Creatinine, Ser: 0.77 mg/dL (ref 0.40–1.20)
GFR: 77.96 mL/min (ref >60.00)
Glucose, Bld: 91 mg/dL (ref 70–99)
Potassium: 4.6 mEq/L (ref 3.5–5.1)
Sodium: 139 mEq/L (ref 135–145)
Total Bilirubin: 0.4 mg/dL (ref 0.2–1.2)
Total Protein: 7.6 g/dL (ref 6.0–8.3)

## 2021-09-24 LAB — LIPID PANEL
Cholesterol: 142 mg/dL (ref 0–200)
HDL: 36.1 mg/dL — ABNORMAL LOW (ref >39.00)
LDL Cholesterol: 87 mg/dL (ref 0–99)
NonHDL: 105.92
Total CHOL/HDL Ratio: 4
Triglycerides: 94 mg/dL (ref 0.0–149.0)
VLDL: 18.8 mg/dL (ref 0.0–40.0)

## 2021-09-24 LAB — HEMOGLOBIN A1C: Hgb A1c MFr Bld: 6.5 % (ref 4.6–6.5)

## 2021-09-24 LAB — VITAMIN D 25 HYDROXY (VIT D DEFICIENCY, FRACTURES): VITD: 59.71 ng/mL (ref 30.00–100.00)

## 2021-09-24 NOTE — Patient Instructions (Signed)
Please complete lab work prior to leaving.   

## 2021-09-24 NOTE — Assessment & Plan Note (Signed)
Tolerating statin, obtain follow-up lipid panel. 

## 2021-09-24 NOTE — Assessment & Plan Note (Signed)
BP is stable on losartan 50mg . Continue same.

## 2021-09-24 NOTE — Progress Notes (Signed)
Subjective:     Patient ID: Elizabeth Vang, female    DOB: Mar 25, 1951, 71 y.o.   MRN: [id number]  Chief Complaint  Patient presents with   Diabetes    Here for follow up   Hypertension    Here for follow up    Diabetes  Hypertension  Patient is in today for follow up.  DM2- diet is "pretty good."  Minimal exercise.   Lab Results  Component Value Date   HGBA1C 6.4 05/15/2021   HGBA1C 6.6 (H) 01/09/2021   HGBA1C 6.4 09/05/2020   Lab Results  Component Value Date   MICROALBUR <0.7 09/05/2020   LDLCALC 79 09/05/2020   CREATININE 0.77 05/15/2021   HTN- Maintained on losartan $RemoveBef'50mg'NhfhisMCGl$ .   BP Readings from Last 3 Encounters:  09/24/21 136/83  05/15/21 (!) 150/78  02/12/21 140/90   GERD- maintained on omeprazole $RemoveBefor'40mg'JgzVoYJGKbQQ$ . She takes PRN which is most days.    Hyperlipidemia- maintained on atorvastatin $RemoveBeforeD'40mg'EYPPxMyizRRUSE$ .  Lab Results  Component Value Date   CHOL 136 09/05/2020   HDL 41.20 09/05/2020   LDLCALC 79 09/05/2020   TRIG 81.0 09/05/2020   CHOLHDL 3 09/05/2020     Health Maintenance Due  Topic Date Due   Zoster Vaccines- Shingrix (1 of 2) Never done   COVID-19 Vaccine (6 - Booster for [employer] series) 07/19/2021   FOOT EXAM  09/05/2021    Past Medical History:  Diagnosis Date   Arthritis    Breast cancer ([place])    Cancer ([place]) 12/12/2000   breast   Diabetes ([place])    Environmental allergies    GERD (gastroesophageal reflux disease)    Hyperlipidemia    Hypertension    Neuropathy    fingers and toes   Osteoporosis     Past Surgical History:  Procedure Laterality Date   APPENDECTOMY  1982   BREAST LUMPECTOMY Right 12/12/00   lumpectomy / axillary node dissection, sentinel node bx (cancer)   GANGLION CYST EXCISION Right 1970   wrist   LAPAROSCOPIC SALPINGO OOPHERECTOMY  1984   benign tumor    Family History  Problem Relation Age of Onset   Hypertension Mother    Stroke Mother    Cancer Mother 79       uterine   Heart disease Mother        CHF   Lung  disease Mother        interstital lung fibrosis   Diabetes Father    Lymphoma Father    Colon cancer Paternal Grandfather 63   Hypertension Brother    Diabetes type II Brother    Hypertension Sister        2 sisters   Diabetes Mellitus II Sister        2 sisters   Breast cancer Sister     Social History   Socioeconomic History   Marital status: Married    Spouse name: Not on file   Number of children: Not on file   Years of education: Not on file   Highest education level: Not on file  Occupational History   Not on file  Tobacco Use   Smoking status: Never   Smokeless tobacco: Never  Substance and Sexual Activity   Alcohol use: Yes    Alcohol/week: 0.0 standard drinks    Comment: rarely drinks wine   Drug use: Not on file   Sexual activity: Not on file  Other Topics Concern   Not on file  Social History Narrative  Lives with her husband and elderly mother   Has 2 sons- live locally, no grandchildren   Enjoys reading, travelling   Therapist, 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     atorvastatin (LIPITOR) 40 MG tablet TAKE 1 TABLET DAILY 90 tablet 1   Cholecalciferol (VITAMIN D3) 5000 UNIT/ML LIQD Take by mouth once a week.     docusate sodium (COLACE) 100 MG capsule Take 100 mg by mouth 2 (two) times daily as needed.      Glucosamine HCl 1500 MG TABS Take 1 tablet by mouth daily.     glucose blood (ONETOUCH VERIO) test strip Use as instructed to check blood sugar once a day. DX E11.40 100 each 1   loratadine (CLARITIN) 10 MG tablet Take 10 mg by mouth daily.      losartan (COZAAR) 50 MG tablet TAKE 1 TABLET DAILY 90 tablet 1   Magnesium 250 MG TABS Take by mouth.     metFORMIN (GLUCOPHAGE) 500 MG tablet TAKE 2 TABLETS 2 TIMES     DAILY WITH MEALS. 360 tablet 1   Omega-3 Fatty Acids (OMEGA 3 PO) Take 1 capsule by mouth daily.     omeprazole (PRILOSEC) 40 MG capsule TAKE 1 CAPSULE DAILY 90 capsule 1   OVER THE COUNTER MEDICATION Take 500 mg by mouth daily. VITAMIN C /  ROSEHIPS     vitamin E 400 UNIT capsule Take 400 Units by mouth daily as needed.      No facility-administered medications prior to visit.    Allergies  Allergen Reactions   Oxycodone-Acetaminophen     hallucinations    ROS    See HPI Objective:    Physical Exam Constitutional:      General: She is not in acute distress.    Appearance: Normal appearance. She is well-developed.  HENT:     Head: Normocephalic and atraumatic.     Right Ear: External ear normal.     Left Ear: External ear normal.  Eyes:     General: No scleral icterus. Neck:     Thyroid: No thyromegaly.  Cardiovascular:     Rate and Rhythm: Normal rate and regular rhythm.     Heart sounds: Normal heart sounds. No murmur heard. Pulmonary:     Effort: Pulmonary effort is normal. No respiratory distress.     Breath sounds: Normal breath sounds. No wheezing.  Musculoskeletal:     Cervical back: Neck supple.  Skin:    General: Skin is warm and dry.  Neurological:     Mental Status: She is alert and oriented to person, place, and time.  Psychiatric:        Mood and Affect: Mood normal.        Behavior: Behavior normal.        Thought Content: Thought content normal.        Judgment: Judgment normal.    BP 136/83 (BP Location: Left Arm, Patient Position: Sitting, Cuff Size: Large)    Pulse 83    Temp 98.8 F (  37.1 C) (Oral)    Resp 16    Wt 201 lb (91.2 kg)    SpO2 99%    BMI 33.45 kg/m  Wt Readings from Last 3 Encounters:  09/24/21 201 lb (91.2 kg)  05/15/21 205 lb (93 kg)  02/12/21 208 lb (94.3  kg)       Assessment & Plan:   Problem List Items Addressed This Visit       Unprioritized   Hyperlipidemia    Tolerating statin, obtain follow up lipid panel.       Relevant Orders   Lipid panel   HTN (hypertension)    BP is stable on losartan $RemoveBef'50mg'JGvPWeCCfR$ . Continue same.       GERD (gastroesophageal reflux disease)    Stable on omeprazole. Continue same.       Controlled diabetes mellitus type 2 with complications (HCC)    Last A1C at goal. Continue metformin, diet, exercise efforts.       Relevant Orders   Hemoglobin A1c   Comp Met (CMET)   Other Visit Diagnoses     Vitamin D deficiency    -  Primary   Relevant Orders   Vitamin D (25 hydroxy)       I am having Elizabeth Vang maintain her docusate sodium, Glucosamine HCl, vitamin E, Omega-3 Fatty Acids (OMEGA 3 PO), loratadine, OVER THE COUNTER MEDICATION, aspirin EC, glucose blood, Vitamin D3, Magnesium, omeprazole, losartan, metFORMIN, and atorvastatin.  No orders of the defined types were placed in this encounter.

## 2021-09-24 NOTE — Assessment & Plan Note (Signed)
Last A1C at goal. Continue metformin, diet, exercise efforts.

## 2021-09-24 NOTE — Assessment & Plan Note (Signed)
Stable on omeprazole. Continue same.  ?

## 2021-10-12 ENCOUNTER — Other Ambulatory Visit: Payer: Self-pay | Admitting: Family

## 2021-10-12 DIAGNOSIS — Z1231 Encounter for screening mammogram for malignant neoplasm of breast: Secondary | ICD-10-CM

## 2021-10-17 ENCOUNTER — Ambulatory Visit
Admission: RE | Admit: 2021-10-17 | Discharge: 2021-10-17 | Disposition: A | Discharge status: Home / Self Care | Payer: Medicare Other | Source: Ambulatory Visit | Attending: Family | Admitting: Family

## 2021-10-17 DIAGNOSIS — Z1231 Encounter for screening mammogram for malignant neoplasm of breast: Secondary | ICD-10-CM | POA: Diagnosis not present

## 2021-12-31 ENCOUNTER — Ambulatory Visit (INDEPENDENT_AMBULATORY_CARE_PROVIDER_SITE_OTHER): Payer: Medicare Other | Admitting: Family

## 2021-12-31 DIAGNOSIS — E118 Type 2 diabetes mellitus with unspecified complications: Secondary | ICD-10-CM | POA: Diagnosis not present

## 2021-12-31 DIAGNOSIS — E785 Hyperlipidemia, unspecified: Secondary | ICD-10-CM | POA: Diagnosis not present

## 2021-12-31 DIAGNOSIS — I10 Essential (primary) hypertension: Secondary | ICD-10-CM

## 2021-12-31 DIAGNOSIS — J301 Allergic rhinitis due to pollen: Secondary | ICD-10-CM

## 2021-12-31 DIAGNOSIS — C50911 Malignant neoplasm of unspecified site of right female breast: Secondary | ICD-10-CM

## 2021-12-31 DIAGNOSIS — K219 Gastro-esophageal reflux disease without esophagitis: Secondary | ICD-10-CM | POA: Diagnosis not present

## 2021-12-31 LAB — BASIC METABOLIC PANEL
BUN: 16 mg/dL (ref 6–23)
CO2: 29 mEq/L (ref 19–32)
Calcium: 9.5 mg/dL (ref 8.4–10.5)
Chloride: 102 mEq/L (ref 96–112)
Creatinine, Ser: 0.79 mg/dL (ref 0.40–1.20)
GFR: 75.45 mL/min (ref >60.00)
Glucose, Bld: 98 mg/dL (ref 70–99)
Potassium: 5.1 mEq/L (ref 3.5–5.1)
Sodium: 138 mEq/L (ref 135–145)

## 2021-12-31 LAB — HEMOGLOBIN A1C: Hgb A1c MFr Bld: 6.3 % (ref 4.6–6.5)

## 2021-12-31 MED ORDER — METFORMIN HCL 500 MG PO TABS: ORAL_TABLET | ORAL | 1 refills | Status: DC

## 2021-12-31 MED ORDER — ATORVASTATIN CALCIUM 40 MG PO TABS: 40.0000 mg | ORAL_TABLET | Freq: Every day | ORAL | 1 refills | Status: DC

## 2021-12-31 MED ORDER — OMEPRAZOLE 40 MG PO CPDR: 40.0000 mg | DELAYED_RELEASE_CAPSULE | Freq: Every day | ORAL | 1 refills | Status: DC

## 2021-12-31 MED ORDER — LOSARTAN POTASSIUM 50 MG PO TABS: 50.0000 mg | ORAL_TABLET | Freq: Every day | ORAL | 1 refills | Status: DC

## 2021-12-31 NOTE — Assessment & Plan Note (Signed)
Mammogram up to date.  

## 2021-12-31 NOTE — Progress Notes (Signed)
? ?Subjective:  ? ?By signing my name below, I, Elizabeth Vang, attest that this documentation has been prepared under the direction and in the presence of [name], NP 12/31/2021  ? ? ? Patient ID: Elizabeth Vang, female    DOB: 1951-01-26, 71 y.o.   MRN: [id number] ? ?Chief Complaint  ?Patient presents with  ? Diabetes  ?  Here for follow up  ? Hypertension  ?  Here for follow up  ? ? ?HPI ?Patient is in today for an office visit and 14 weeks f/u. ? ?No new concerns at this time. ? ?Diabetes- She takes 500 mg metformin 2x a day and is doing well on it.  ?Lab Results  ?Component Value Date  ? HGBA1C 6.5 09/24/2021  ?  ?Allergies- She reports her allergies have been terrible this year. She has been taking 10 mg Claritin and does well with it. Adds that it is improving now. ?Hypertension- Her blood pressure is stable at today's visit. She takes 50 mg losartan to manage the blood pressure.  ?BP Readings from Last 3 Encounters:  ?12/31/21 133/80  ?09/24/21 136/83  ?05/15/21 (!) 150/78  ?  ?GERD- She takes 50 mg omeprazole almost every day. Her symptoms are stable and she is not worried at this time. ?Hyperlipidemia- She is taking 40 mg Lipitor and is doing well on it.  ?Immunizations- She has 5 Covid-19 vaccines at this time. She has not received the shingles vaccine.  ? ?Past Medical History:  ?Diagnosis Date  ? Arthritis   ? Breast cancer ([place])   ? Cancer ([place]) 12/12/2000  ? breast  ? Diabetes ([place])   ? Environmental allergies   ? GERD (gastroesophageal reflux disease)   ? Hyperlipidemia   ? Hypertension   ? Neuropathy   ? fingers and toes  ? Osteoporosis   ? ? ?Past Surgical History:  ?Procedure Laterality Date  ? APPENDECTOMY  1982  ? BREAST LUMPECTOMY Right 12/12/00  ? lumpectomy / axillary node dissection, sentinel node bx (cancer)  ? GANGLION CYST EXCISION Right 1970  ? wrist  ? [place]  1984  ? benign tumor  ? ? ?Family History  ?Problem Relation Age of Onset  ?  Hypertension Mother   ? Stroke Mother   ? Cancer Mother 35  ?     uterine  ? Heart disease Mother   ?     CHF  ? Lung disease Mother   ?     interstital lung fibrosis  ? Diabetes Father   ? Lymphoma Father   ? Colon cancer Paternal Grandfather 2  ? Hypertension Brother   ? Diabetes type II Brother   ? Hypertension Sister   ?     2 sisters  ? Diabetes Mellitus II Sister   ?     2 sisters  ? Breast cancer Sister   ? ? ?Social History  ? ?Socioeconomic History  ? Marital status: Married  ?  Spouse name: Not on file  ? Number of children: Not on file  ? Years of education: Not on file  ? Highest education level: Not on file  ?Occupational History  ? Not on file  ?Tobacco Use  ? Smoking status: Never  ? Smokeless tobacco: Never  ?Substance and Sexual Activity  ? Alcohol use: Yes  ?  Alcohol/week: 0.0 standard drinks  ?  Comment: rarely drinks wine  ? Drug use: Not on file  ? Sexual activity: Not on file  ?Other  Topics Concern  ? Not on file  ?Social History Narrative  ? Lives with her husband and elderly mother  ? Has 2 sons- live locally, no grandchildren  ? Enjoys reading, travelling  ? 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   ? ?Social Determinants of Health  ? ?Financial Resource Strain: Not on file  ?Food Insecurity: Not on file  ?Transportation Needs: Not on file  ?Physical Activity: Not on file  ?Stress: Not on file  ?Social Connections: Not on file  ?Intimate Partner Violence: Not on file  ? ? ?Outpatient Medications Prior to Visit  ?Medication Sig Dispense Refill  ? aspirin EC 81 MG tablet Take 1 tablet (81 mg total) by mouth daily.    ? Cholecalciferol (VITAMIN D3) 5000 UNIT/ML LIQD Take by mouth once a week.    ? docusate sodium (COLACE) 100 MG capsule Take 100 mg by mouth 2 (two) times daily as needed.     ? Glucosamine HCl 1500 MG TABS Take 1 tablet by mouth daily.    ? glucose blood (ONETOUCH VERIO) test strip Use as instructed to check blood sugar once a day. DX E11.40 100 each 1  ? loratadine (CLARITIN) 10  MG tablet Take 10 mg by mouth daily.    ? Magnesium 250 MG TABS Take by mouth.    ? Omega-3 Fatty Acids (OMEGA 3 PO) Take 1 capsule by mouth daily.    ? OVER THE COUNTER MEDICATION Take 500 mg by mouth daily. VITAMIN C /  ROSEHIPS    ? vitamin E 400 UNIT capsule Take 400 Units by mouth daily as needed.     ? atorvastatin (LIPITOR) 40 MG tablet TAKE 1 TABLET DAILY 90 tablet 1  ? losartan (COZAAR) 50 MG tablet TAKE 1 TABLET DAILY 90 tablet 1  ? metFORMIN (GLUCOPHAGE) 500 MG tablet TAKE 2 TABLETS 2 TIMES     DAILY WITH MEALS. 360 tablet 1  ? omeprazole (PRILOSEC) 40 MG capsule TAKE 1 CAPSULE DAILY 90 capsule 1  ? ?No facility-administered medications prior to visit.  ? ? ?Allergies  ?Allergen Reactions  ? Oxycodone-Acetaminophen   ?  hallucinations  ? ? ?Review of Systems  ?Constitutional:  Negative for fever.  ?HENT:  Negative for congestion and sinus pain.   ?Respiratory:  Negative for cough.   ?Cardiovascular:  Negative for chest pain.  ?Musculoskeletal:  Negative for myalgias.  ?Skin:  Negative for rash.  ?Neurological:  Negative for dizziness.  ?Psychiatric/Behavioral:  Negative for depression.   ? ?   ?Objective:  ?  ?Physical Exam ?Constitutional:   ?   General: She is not in acute distress. ?   Appearance: Normal appearance. She is not ill-appearing.  ?HENT:  ?   Head: Normocephalic and atraumatic.  ?   Right Ear: External ear normal.  ?   Left Ear: External ear normal.  ?Cardiovascular:  ?   Rate and Rhythm: Normal rate and regular rhythm.  ?   Pulses: Normal pulses.  ?   Heart sounds: Normal heart sounds. No murmur heard. ?Pulmonary:  ?   Effort: Pulmonary effort is normal. No respiratory distress.  ?   Breath sounds: Normal breath sounds. No wheezing or rhonchi.  ?Abdominal:  ?   Tenderness: There is no rebound.  ?Musculoskeletal:  ?   Cervical back: Neck supple.  ?Feet:  ?   Comments: Diabetic Foot Exam - Simple   ?No data filed ?   ?Lymphadenopathy:  ?   Cervical: No cervical  adenopathy.  ?Skin: ?    General: Skin is warm and dry.  ?Neurological:  ?   Mental Status: She is alert and oriented to person, place, and time.  ?Psychiatric:     ?   Behavior: Behavior normal.     ?   Judgment: Judgment normal.  ? ? ?BP 133/80 (BP Location: Left Arm, Patient Position: Sitting, Cuff Size: Large)   Pulse 74   Temp 98.4 ?F (36.9 ?C) (Oral)   Resp 16   Wt 200 lb 9.6 oz (91 kg)   SpO2 98%   BMI 33.38 kg/m?  ?Wt Readings from Last 3 Encounters:  ?12/31/21 200 lb 9.6 oz (91 kg)  ?09/24/21 201 lb (91.2 kg)  ?05/15/21 205 lb (93 kg)  ? ? ?Diabetic Foot Exam - Simple   ?Simple Foot Form ?Diabetic Foot exam was performed with the following findings: Yes 12/31/2021  8:32 AM  ?Visual Inspection ?No deformities, no ulcerations, no other skin breakdown bilaterally: Yes ?Sensation Testing ?Intact to touch and monofilament testing bilaterally: Yes ?Pulse Check ?Posterior Tibialis and Dorsalis pulse intact bilaterally: Yes ?Comments ?  ? ? ? ?   ?Assessment & Plan:  ? ?Problem List Items Addressed This Visit   ? ?  ? Unprioritized  ? Hyperlipidemia  ?  Lab Results  ?Component Value Date  ? CHOL 142 09/24/2021  ? HDL 36.10 (L) 09/24/2021  ? [place] 87 09/24/2021  ? TRIG 94.0 09/24/2021  ? CHOLHDL 4 09/24/2021  ?LDL at goal. Continue atorvastatin.  ?  ?  ? Relevant Medications  ? atorvastatin (LIPITOR) 40 MG tablet  ? losartan (COZAAR) 50 MG tablet  ? HTN (hypertension)  ?  BP Readings from Last 3 Encounters:  ?12/31/21 133/80  ?09/24/21 136/83  ?05/15/21 (!) 150/78  ?Stable on losartan '50mg'$  once daily. Continue same.  ?  ?  ? Relevant Medications  ? atorvastatin (LIPITOR) 40 MG tablet  ? losartan (COZAAR) 50 MG tablet  ? GERD (gastroesophageal reflux disease)  ?  Stable on prilosec, continue same.  ? ?  ?  ? Relevant Medications  ? omeprazole (PRILOSEC) 40 MG capsule  ? Controlled diabetes mellitus type 2 with complications ([place])  ?  Lab Results  ?Component Value Date  ? HGBA1C 6.5 09/24/2021  ? HGBA1C 6.4 05/15/2021  ? HGBA1C 6.6 (H)  01/09/2021  ? ?Lab Results  ?Component Value Date  ? MICROALBUR <0.7 09/05/2020  ? [place] 87 09/24/2021  ? CREATININE 0.77 09/24/2021  ?A1C has been stable on metformin. Continue same. Obtain follow up A1C.  ?  ?  ? [place]

## 2021-12-31 NOTE — Assessment & Plan Note (Signed)
Stable on prilosec, continue same.  ?

## 2021-12-31 NOTE — Assessment & Plan Note (Signed)
Some improvement. Continues claritin.  ?

## 2021-12-31 NOTE — Patient Instructions (Signed)
Please complete lab work prior to leaving.   

## 2021-12-31 NOTE — Assessment & Plan Note (Signed)
BP Readings from Last 3 Encounters:  ?12/31/21 133/80  ?09/24/21 136/83  ?05/15/21 (!) 150/78  ? ?Stable on losartan '50mg'$  once daily. Continue same.  ?

## 2021-12-31 NOTE — Assessment & Plan Note (Addendum)
Lab Results  ?Component Value Date  ? HGBA1C 6.5 09/24/2021  ? HGBA1C 6.4 05/15/2021  ? HGBA1C 6.6 (H) 01/09/2021  ? ?Lab Results  ?Component Value Date  ? MICROALBUR <0.7 09/05/2020  ? [place] 87 09/24/2021  ? CREATININE 0.77 09/24/2021  ? ?A1C has been stable on metformin. Continue same. Obtain follow up A1C.  ?

## 2021-12-31 NOTE — Assessment & Plan Note (Signed)
Lab Results  ?Component Value Date  ? CHOL 142 09/24/2021  ? HDL 36.10 (L) 09/24/2021  ? [place] 87 09/24/2021  ? TRIG 94.0 09/24/2021  ? CHOLHDL 4 09/24/2021  ? ?LDL at goal. Continue atorvastatin.  ?

## 2022-01-19 ENCOUNTER — Ambulatory Visit
Admission: RE | Admit: 2022-01-19 | Discharge: 2022-01-19 | Disposition: A | Discharge status: Home / Self Care | Payer: Medicare Other | Source: Ambulatory Visit | Attending: Urgent Care | Admitting: Urgent Care

## 2022-01-19 VITALS — BP 151/86 | HR 87 | Temp 98.5°F | Resp 18

## 2022-01-19 DIAGNOSIS — L03211 Cellulitis of face: Secondary | ICD-10-CM

## 2022-01-19 MED ORDER — DOXYCYCLINE HYCLATE 100 MG PO CAPS: 100.0000 mg | ORAL_CAPSULE | Freq: Two times a day (BID) | ORAL | 0 refills | Status: DC

## 2022-01-19 NOTE — ED Provider Notes (Signed)
[place]   MRN: [id number] DOB: 31-May-1951  Subjective:   Elizabeth Vang is a 71 y.o. female presenting for 2-day history of acute onset right-sided facial redness, swelling and soreness.  Patient has not had any blisters develop.  She is not concerned for shingles.  She has had bouts of cellulitis but was typically over the breast where she had cancer and was receiving treatment.  She has never had it anywhere else.  No current facility-administered medications for this encounter.  Current Outpatient Medications:    aspirin EC 81 MG tablet, Take 1 tablet (81 mg total) by mouth daily., Disp: , Rfl:    atorvastatin (LIPITOR) 40 MG tablet, Take 1 tablet (40 mg total) by mouth daily., Disp: 90 tablet, Rfl: 1   Cholecalciferol (VITAMIN D3) 5000 UNIT/ML LIQD, Take by mouth once a week., Disp: , Rfl:    docusate sodium (COLACE) 100 MG capsule, Take 100 mg by mouth 2 (two) times daily as needed. , Disp: , Rfl:    Glucosamine HCl 1500 MG TABS, Take 1 tablet by mouth daily., Disp: , Rfl:    glucose blood (ONETOUCH VERIO) test strip, Use as instructed to check blood sugar once a day. DX E11.40, Disp: 100 each, Rfl: 1   loratadine (CLARITIN) 10 MG tablet, Take 10 mg by mouth daily., Disp: , Rfl:    losartan (COZAAR) 50 MG tablet, Take 1 tablet (50 mg total) by mouth daily., Disp: 90 tablet, Rfl: 1   Magnesium 250 MG TABS, Take by mouth., Disp: , Rfl:    metFORMIN (GLUCOPHAGE) 500 MG tablet, TAKE 2 TABLETS 2 TIMES     DAILY WITH MEALS., Disp: 360 tablet, Rfl: 1   Omega-3 Fatty Acids (OMEGA 3 PO), Take 1 capsule by mouth daily., Disp: , Rfl:    omeprazole (PRILOSEC) 40 MG capsule, Take 1 capsule (40 mg total) by mouth daily., Disp: 90 capsule, Rfl: 1   OVER THE COUNTER MEDICATION, Take 500 mg by mouth daily. VITAMIN C /  ROSEHIPS, Disp: , Rfl:    vitamin E 400 UNIT capsule, Take 400 Units by mouth daily as needed. , Disp: , Rfl:    Allergies  Allergen Reactions    Oxycodone-Acetaminophen     hallucinations    Past Medical History:  Diagnosis Date   Arthritis    Breast cancer ([place])    Cancer ([place]) 12/12/2000   breast   Diabetes ([place])    Environmental allergies    GERD (gastroesophageal reflux disease)    Hyperlipidemia    Hypertension    Neuropathy    fingers and toes   Osteoporosis      Past Surgical History:  Procedure Laterality Date   APPENDECTOMY  1982   BREAST LUMPECTOMY Right 12/12/00   lumpectomy / axillary node dissection, sentinel node bx (cancer)   GANGLION CYST EXCISION Right 1970   wrist   LAPAROSCOPIC SALPINGO OOPHERECTOMY  1984   benign tumor    Family History  Problem Relation Age of Onset   Hypertension Mother    Stroke Mother    Cancer Mother 21       uterine   Heart disease Mother        CHF   Lung disease Mother        interstital lung fibrosis   Diabetes Father    Lymphoma Father    Colon cancer Paternal Grandfather 21   Hypertension Brother    Diabetes type II Brother  Hypertension Sister        2 sisters   Diabetes Mellitus II Sister        2 sisters   Breast cancer Sister     Social History   Tobacco Use   Smoking status: Never   Smokeless tobacco: Never  Substance Use Topics   Alcohol use: Yes    Alcohol/week: 0.0 standard drinks    Comment: rarely drinks wine    ROS   Objective:   Vitals: BP (!) 151/86 (BP Location: Left Arm)   Pulse 87   Temp 98.5 F (36.9 C) (Oral)   Resp 18   SpO2 96%   Physical Exam Constitutional:      General: She is not in acute distress.    Appearance: Normal appearance. She is well-developed and normal weight. She is not ill-appearing, toxic-appearing or diaphoretic.  HENT:     Head: Normocephalic and atraumatic.      Right Ear: Tympanic membrane, ear canal and external ear normal. No drainage or tenderness. No middle ear effusion. There is no impacted cerumen. Tympanic membrane is not erythematous.     Left Ear: Tympanic membrane, ear canal  and external ear normal. No drainage or tenderness.  No middle ear effusion. There is no impacted cerumen. Tympanic membrane is not erythematous.     Nose: Nose normal. No congestion or rhinorrhea.     Mouth/Throat:     Mouth: Mucous membranes are moist. No oral lesions.     Pharynx: No pharyngeal swelling, oropharyngeal exudate, posterior oropharyngeal erythema or uvula swelling.     Tonsils: No tonsillar exudate or tonsillar abscesses.  Eyes:     General: No scleral icterus.       Right eye: No discharge.        Left eye: No discharge.     Extraocular Movements: Extraocular movements intact.     Right eye: Normal extraocular motion.     Left eye: Normal extraocular motion.     Conjunctiva/sclera: Conjunctivae normal.  Cardiovascular:     Rate and Rhythm: Normal rate.  Pulmonary:     Effort: Pulmonary effort is normal.  Musculoskeletal:     Cervical back: Normal range of motion and neck supple.  Lymphadenopathy:     Cervical: No cervical adenopathy.  Skin:    General: Skin is warm and dry.     Findings: Rash (erythematic rash that is warm and tender over the face but not the ear) present.  Neurological:     General: No focal deficit present.     Mental Status: She is alert and oriented to person, place, and time.  Psychiatric:        Mood and Affect: Mood normal.        Behavior: Behavior normal.       Assessment and Plan :   PDMP not reviewed this encounter.  1. Facial cellulitis    I suspect facial cellulitis as she is more tender across the right side of her face as opposed to the auricle of the ear.  Discussed possibility of perichondritis of the auricle of the ear versus developing shingles.  For now patient prefers to address this for facial cellulitis and I am agreeable.  Start doxycycline.  Recommended Tylenol for pain relief.  Strict return to clinic precautions. Counseled patient on potential for adverse effects with medications prescribed today, patient  verbalized understanding.    Jaynee Eagles, PA-C 01/19/22 1137

## 2022-01-19 NOTE — ED Triage Notes (Signed)
Pt c/o right ear swelling, the right side of her face is reddened and warm to touch. The patient states she hears muffled sound when communicating in her right ear. Started: yesterday

## 2022-04-02 DIAGNOSIS — H25813 Combined forms of age-related cataract, bilateral: Secondary | ICD-10-CM | POA: Diagnosis not present

## 2022-04-02 DIAGNOSIS — H524 Presbyopia: Secondary | ICD-10-CM | POA: Diagnosis not present

## 2022-04-02 DIAGNOSIS — H04123 Dry eye syndrome of bilateral lacrimal glands: Secondary | ICD-10-CM | POA: Diagnosis not present

## 2022-04-02 DIAGNOSIS — E119 Type 2 diabetes mellitus without complications: Secondary | ICD-10-CM | POA: Diagnosis not present

## 2022-04-02 LAB — HM DIABETES EYE EXAM

## 2022-04-15 ENCOUNTER — Ambulatory Visit (INDEPENDENT_AMBULATORY_CARE_PROVIDER_SITE_OTHER): Payer: Medicare Other | Admitting: Family

## 2022-04-15 VITALS — BP 132/81 | HR 72 | Temp 98.5°F | Resp 16 | Wt 201.0 lb

## 2022-04-15 DIAGNOSIS — I10 Essential (primary) hypertension: Secondary | ICD-10-CM | POA: Diagnosis not present

## 2022-04-15 DIAGNOSIS — C50911 Malignant neoplasm of unspecified site of right female breast: Secondary | ICD-10-CM

## 2022-04-15 DIAGNOSIS — E559 Vitamin D deficiency, unspecified: Secondary | ICD-10-CM | POA: Diagnosis not present

## 2022-04-15 DIAGNOSIS — E118 Type 2 diabetes mellitus with unspecified complications: Secondary | ICD-10-CM | POA: Diagnosis not present

## 2022-04-15 DIAGNOSIS — E785 Hyperlipidemia, unspecified: Secondary | ICD-10-CM

## 2022-04-15 LAB — BASIC METABOLIC PANEL
BUN: 16 mg/dL (ref 6–23)
CO2: 29 mEq/L (ref 19–32)
Calcium: 9.3 mg/dL (ref 8.4–10.5)
Chloride: 101 mEq/L (ref 96–112)
Creatinine, Ser: 0.8 mg/dL (ref 0.40–1.20)
GFR: 74.17 mL/min (ref >60.00)
Glucose, Bld: 95 mg/dL (ref 70–99)
Potassium: 4.9 mEq/L (ref 3.5–5.1)
Sodium: 139 mEq/L (ref 135–145)

## 2022-04-15 LAB — VITAMIN D 25 HYDROXY (VIT D DEFICIENCY, FRACTURES): VITD: 56.72 ng/mL (ref 30.00–100.00)

## 2022-04-15 LAB — HEMOGLOBIN A1C: Hgb A1c MFr Bld: 6.5 % (ref 4.6–6.5)

## 2022-04-15 NOTE — Progress Notes (Signed)
Subjective:     Patient ID: Elizabeth Vang, female    DOB: 11-21-1950, 71 y.o.   MRN: [id number]  Chief Complaint  Patient presents with   Diabetes    Here for follow up   Hypertension    Here for follow up     Patient is in today for follow up.  DM2- maintained on metformin.  Lab Results  Component Value Date   HGBA1C 6.3 12/31/2021   HGBA1C 6.5 09/24/2021   HGBA1C 6.4 05/15/2021   Lab Results  Component Value Date   MICROALBUR <0.7 09/05/2020   LDLCALC 87 09/24/2021   CREATININE 0.79 12/31/2021   HTN- maintained on losartan.  BP Readings from Last 3 Encounters:  04/15/22 132/81  01/19/22 (!) 151/86  12/31/21 133/80   Hyperlipidemia- maintained on lipitor.  Lab Results  Component Value Date   CHOL 142 09/24/2021   HDL 36.10 (L) 09/24/2021   LDLCALC 87 09/24/2021   TRIG 94.0 09/24/2021   CHOLHDL 4 09/24/2021   She is taking vit D 3x a week.   Health Maintenance Due  Topic Date Due   Zoster Vaccines- Shingrix (1 of 2) Never done   COVID-19 Vaccine (6 - Pfizer risk series) 07/19/2021   INFLUENZA VACCINE  03/26/2022    Past Medical History:  Diagnosis Date   Arthritis    Breast cancer ([place])    Cancer ([place]) 12/12/2000   breast   Diabetes ([employer])    Environmental allergies    GERD (gastroesophageal reflux disease)    Hyperlipidemia    Hypertension    Neuropathy    fingers and toes   Osteoporosis     Past Surgical History:  Procedure Laterality Date   APPENDECTOMY  1982   BREAST LUMPECTOMY Right 12/12/00   lumpectomy / axillary node dissection, sentinel node bx (cancer)   GANGLION CYST EXCISION Right 1970   wrist   LAPAROSCOPIC SALPINGO OOPHERECTOMY  1984   benign tumor    Family History  Problem Relation Age of Onset   Hypertension Mother    Stroke Mother    Cancer Mother 22       uterine   Heart disease Mother        CHF   Lung disease Mother        interstital lung fibrosis   Diabetes Father    Lymphoma Father    Colon  cancer Paternal Grandfather 31   Hypertension Brother    Diabetes type II Brother    Hypertension Sister        2 sisters   Diabetes Mellitus II Sister        2 sisters   Breast cancer Sister     Social History   Socioeconomic History   Marital status: Married    Spouse name: Not on file   Number of children: Not on file   Years of education: Not on file   Highest education level: Not on file  Occupational History   Not on file  Tobacco Use   Smoking status: Never   Smokeless tobacco: Never  Substance and Sexual Activity   Alcohol use: Yes    Alcohol/week: 0.0 standard drinks of alcohol    Comment: rarely drinks wine   Drug use: Not on file   Sexual activity: Not on file  Other Topics Concern   Not on file  Social History Narrative   Lives with her husband and elderly mother   Has 2 sons- live locally, no grandchildren  Enjoys reading, travelling   Therapist, 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     atorvastatin (LIPITOR) 40 MG tablet Take 1 tablet (40 mg total) by mouth daily. 90 tablet 1   Cholecalciferol (VITAMIN D3) 5000 UNIT/ML LIQD Take 5,000 Units by mouth 3 (three) times a week.     docusate sodium (COLACE) 100 MG capsule Take 100 mg by mouth 2 (two) times daily as needed.      Glucosamine HCl 1500 MG TABS Take 1 tablet by mouth daily.     glucose blood (ONETOUCH VERIO) test strip Use as instructed to check blood sugar once a day. DX E11.40 100 each 1   loratadine (CLARITIN) 10 MG tablet Take 10 mg by mouth daily.     losartan (COZAAR) 50 MG tablet Take 1 tablet  (50 mg total) by mouth daily. 90 tablet 1   Magnesium 250 MG TABS Take by mouth.     metFORMIN (GLUCOPHAGE) 500 MG tablet TAKE 2 TABLETS 2 TIMES     DAILY WITH MEALS. 360 tablet 1   Omega-3 Fatty Acids (OMEGA 3 PO) Take 1 capsule by mouth daily.     omeprazole (PRILOSEC) 40 MG capsule Take 1 capsule (40 mg total) by mouth daily. 90 capsule 1   OVER THE COUNTER MEDICATION Take 500 mg by mouth daily. VITAMIN C /  ROSEHIPS     vitamin E 400 UNIT capsule Take 400 Units by mouth daily as needed.      doxycycline (VIBRAMYCIN) 100 MG capsule Take 1 capsule (100 mg total) by mouth 2 (two) times daily. 20 capsule 0   No facility-administered medications prior to visit.    Allergies  Allergen Reactions   Oxycodone-Acetaminophen     hallucinations    ROS See HPI    Objective:    Physical Exam Constitutional:      General: She is not in acute distress.    Appearance: Normal appearance. She is well-developed.  HENT:     Head: Normocephalic and atraumatic.     Right Ear: External ear normal.     Left Ear: External ear normal.  Eyes:     General: No scleral icterus. Neck:     Thyroid: No thyromegaly.  Cardiovascular:     Rate and Rhythm: Normal rate and regular rhythm.     Heart sounds: Normal heart sounds. No murmur heard. Pulmonary:     Effort: Pulmonary effort is normal. No respiratory distress.     Breath sounds: Normal breath sounds. No wheezing.  Musculoskeletal:     Cervical back: Neck supple.  Skin:    General: Skin is warm and dry.  Neurological:     Mental Status: She is alert and oriented to person, place, and time.  Psychiatric:        Mood and Affect: Mood normal.        Behavior: Behavior normal.        Thought Content: Thought content normal.        Judgment: Judgment normal.  BP 132/81 (BP Location: Left Arm, Patient Position: Sitting, Cuff Size: Large)   Pulse 72   Temp 98.5 F (36.9 C) (Oral)   Resp 16   Wt 201 lb (91.2 kg)   SpO2 99%   BMI 33.45  kg/m  Wt Readings from Last 3 Encounters:  04/15/22 201 lb (91.2 kg)  12/31/21 200 lb 9.6 oz (91 kg)  09/24/21 201 lb (91.2 kg)       Assessment & Plan:   Problem List Items Addressed This Visit       Unprioritized   Hyperlipidemia    LDL at goal. Continue atorvastatin.       HTN (hypertension)    BP Readings from Last 3 Encounters:  04/15/22 132/81  01/19/22 (!) 151/86  12/31/21 133/80  BP at goal. Continue current dose of losartan.       Controlled diabetes mellitus type 2 with complications ([place]) - Primary   Relevant Orders   Hemoglobin I2M   Basic metabolic panel   Breast cancer ([place])    Up to date on screening mammography.      Other Visit Diagnoses     Vitamin D deficiency       Relevant Orders   VITAMIN D 25 Hydroxy (Vit-D Deficiency, Fractures)      Recommended covid booster and flu shot this fall at the pharmacy.   I have discontinued Jennette Kettle. Chapdelaine's doxycycline. I am also having her maintain her docusate sodium, Glucosamine HCl, vitamin E, Omega-3 Fatty Acids (OMEGA 3 PO), loratadine, OVER THE COUNTER MEDICATION, aspirin EC, glucose blood, Vitamin D3, Magnesium, atorvastatin, losartan, metFORMIN, and omeprazole.  No orders of the defined types were placed in this encounter.

## 2022-04-15 NOTE — Assessment & Plan Note (Signed)
LDL at goal. Continue atorvastatin.

## 2022-04-15 NOTE — Assessment & Plan Note (Signed)
BP Readings from Last 3 Encounters:  04/15/22 132/81  01/19/22 (!) 151/86  12/31/21 133/80   BP at goal. Continue current dose of losartan.

## 2022-04-15 NOTE — Assessment & Plan Note (Signed)
Up to date on screening mammography.

## 2022-06-28 ENCOUNTER — Ambulatory Visit (INDEPENDENT_AMBULATORY_CARE_PROVIDER_SITE_OTHER): Payer: Medicare Other

## 2022-06-28 DIAGNOSIS — Z23 Encounter for immunization: Secondary | ICD-10-CM

## 2022-07-15 ENCOUNTER — Other Ambulatory Visit: Payer: Self-pay | Admitting: Family

## 2022-08-16 ENCOUNTER — Encounter: Payer: Self-pay | Admitting: Family

## 2022-08-16 ENCOUNTER — Ambulatory Visit (INDEPENDENT_AMBULATORY_CARE_PROVIDER_SITE_OTHER): Payer: Medicare Other | Admitting: Family

## 2022-08-16 VITALS — BP 148/85 | HR 82 | Temp 98.7°F | Resp 16 | Ht 65.0 in | Wt 200.0 lb

## 2022-08-16 DIAGNOSIS — I10 Essential (primary) hypertension: Secondary | ICD-10-CM

## 2022-08-16 DIAGNOSIS — K219 Gastro-esophageal reflux disease without esophagitis: Secondary | ICD-10-CM | POA: Diagnosis not present

## 2022-08-16 DIAGNOSIS — E118 Type 2 diabetes mellitus with unspecified complications: Secondary | ICD-10-CM | POA: Diagnosis not present

## 2022-08-16 LAB — MICROALBUMIN / CREATININE URINE RATIO
Creatinine,U: 44.6 mg/dL
Microalb Creat Ratio: 4.2 mg/g (ref 0.0–30.0)
Microalb, Ur: 1.9 mg/dL (ref 0.0–1.9)

## 2022-08-16 LAB — COMPREHENSIVE METABOLIC PANEL
ALT: 24 U/L (ref 0–35)
AST: 26 U/L (ref 0–37)
Albumin: 4.4 g/dL (ref 3.5–5.2)
Alkaline Phosphatase: 58 U/L (ref 39–117)
BUN: 14 mg/dL (ref 6–23)
CO2: 30 mEq/L (ref 19–32)
Calcium: 9.5 mg/dL (ref 8.4–10.5)
Chloride: 99 mEq/L (ref 96–112)
Creatinine, Ser: 0.74 mg/dL (ref 0.40–1.20)
GFR: 81.25 mL/min (ref >60.00)
Glucose, Bld: 80 mg/dL (ref 70–99)
Potassium: 4.8 mEq/L (ref 3.5–5.1)
Sodium: 139 mEq/L (ref 135–145)
Total Bilirubin: 0.4 mg/dL (ref 0.2–1.2)
Total Protein: 7.6 g/dL (ref 6.0–8.3)

## 2022-08-16 LAB — HEMOGLOBIN A1C: Hgb A1c MFr Bld: 6.3 % (ref 4.6–6.5)

## 2022-08-16 MED ORDER — METFORMIN HCL 500 MG PO TABS: ORAL_TABLET | ORAL | 1 refills | Status: DC

## 2022-08-16 MED ORDER — LOSARTAN POTASSIUM 50 MG PO TABS: 75.0000 mg | ORAL_TABLET | Freq: Every day | ORAL | 1 refills | Status: DC

## 2022-08-16 NOTE — Assessment & Plan Note (Signed)
BP above goal, increase losartan to '75mg'$ .

## 2022-08-16 NOTE — Assessment & Plan Note (Signed)
Lab Results  Component Value Date   HGBA1C 6.5 04/15/2022   HGBA1C 6.3 12/31/2021   HGBA1C 6.5 09/24/2021   Lab Results  Component Value Date   MICROALBUR <0.7 09/05/2020   LDLCALC 87 09/24/2021   CREATININE 0.80 04/15/2022   Maintained on metformin.  Obtain A1c.

## 2022-08-16 NOTE — Assessment & Plan Note (Signed)
Stable on omeprazole. Will continue same.

## 2022-08-16 NOTE — Progress Notes (Signed)
Subjective:   By signing my name below, I, [name], attest that this documentation has been prepared under the direction and in the presence of [place], NP 08/16/2022   Patient ID: Elizabeth Vang, female    DOB: 06/20/1951, 71 y.o.   MRN: [id number]  Chief Complaint  Patient presents with   Diabetes    Here for followup    HPI Patient is in today for an office visit  Refills: She is requesting a refill of 500 mg of Metformin.   Blood Pressure: She reports that her blood pressure has been elevated. She states that she is not as active as she once was and her husbands medical condition is causing her stress. She is her husbands primary caregiver. Upon recheck her blood pressure is 148/85 mmHg. She states that most times at home, her systolic blood pressure is in the upper 140's.  BP Readings from Last 3 Encounters:  08/16/22 (!) 148/85  04/15/22 132/81  01/19/22 (!) 151/86   Blood Sugar: She regularly checks her blood sugars at home and reports a range of 85 - 110 mg/dL. She is currently taking two tablets of 500 mg of Metformin. Lab Results  Component Value Date   HGBA1C 6.5 04/15/2022   Reflux: She is currently taking 40 mg of Omeprazole, she states that she is not able to skip her medication for more than two days.   Immunizations: She is not UTD on the Covid or Shingles vaccine.   Health Maintenance Due  Topic Date Due   Medicare Annual Wellness (AWV)  Never done   Zoster Vaccines- Shingrix (1 of 2) Never done   Diabetic kidney evaluation - Urine ACR  09/05/2021   COVID-19 Vaccine (6 - 2023-24 season) 04/26/2022    Past Medical History:  Diagnosis Date   Arthritis    Breast cancer ([place])    Cancer ([place]) 12/12/2000   breast   Diabetes ([place])    Environmental allergies    GERD (gastroesophageal reflux disease)    Hyperlipidemia    Hypertension    Neuropathy    fingers and toes   Osteoporosis     Past Surgical History:  Procedure  Laterality Date   APPENDECTOMY  1982   BREAST LUMPECTOMY Right 12/12/00   lumpectomy / axillary node dissection, sentinel node bx (cancer)   GANGLION CYST EXCISION Right 1970   wrist   LAPAROSCOPIC SALPINGO OOPHERECTOMY  1984   benign tumor    Family History  Problem Relation Age of Onset   Hypertension Mother    Stroke Mother    Cancer Mother 8       uterine   Heart disease Mother        CHF   Lung disease Mother        interstital lung fibrosis   Diabetes Father    Lymphoma Father    Colon cancer Paternal Grandfather 13   Hypertension Brother    Diabetes type II Brother    Hypertension Sister        2 sisters   Diabetes Mellitus II Sister        2 sisters   Breast cancer Sister     Social History   Socioeconomic History   Marital status: Married    Spouse name: Not on file   Number of children: Not on file   Years of education: Not on file   Highest education level: Not on file  Occupational History   Not on file  Tobacco Use   Smoking status: Never   Smokeless tobacco: Never  Substance and Sexual Activity   Alcohol use: Yes    Alcohol/week: 0.0 standard drinks of alcohol    Comment: rarely drinks wine   Drug use: Not on file   Sexual activity: Not on file  Other Topics Concern   Not on file  Social History Narrative   Lives with her husband and elderly mother   Has 2 sons- live locally, no grandchildren   Enjoys reading, travelling   Therapist, 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     atorvastatin (LIPITOR) 40 MG tablet Take 1 tablet (40 mg total) by  mouth daily. 90 tablet 1   Cholecalciferol (VITAMIN D3) 5000 UNIT/ML LIQD Take 5,000 Units by mouth 3 (three) times a week.     docusate sodium (COLACE) 100 MG capsule Take 100 mg by mouth 2 (two) times daily as needed.      Glucosamine HCl 1500 MG TABS Take 1 tablet by mouth daily.     glucose blood (ONETOUCH VERIO) test strip Use as instructed to check blood sugar once a day. DX E11.40 100 each 1   loratadine (CLARITIN) 10 MG tablet Take 10 mg by mouth daily.     Magnesium 250 MG TABS Take by mouth.     Omega-3 Fatty Acids (OMEGA 3 PO) Take 1 capsule by mouth daily.     omeprazole (PRILOSEC) 40 MG capsule Take 1 capsule (40 mg total) by mouth daily. 90 capsule 1   OVER THE COUNTER MEDICATION Take 500 mg by mouth daily. VITAMIN C /  ROSEHIPS     vitamin E 400 UNIT capsule Take 400 Units by mouth daily as needed.      losartan (COZAAR) 50 MG tablet Take 1 tablet (50 mg total) by mouth daily. 90 tablet 1   metFORMIN (GLUCOPHAGE) 500 MG tablet TAKE 2 TABLETS 2 TIMES     DAILY WITH MEALS. 360 tablet 1   No facility-administered medications prior to visit.    Allergies  Allergen Reactions   Oxycodone-Acetaminophen     hallucinations    ROS See hpi    Objective:    Physical Exam Constitutional:      General: She is not in acute distress.    Appearance: Normal appearance. She is not ill-appearing.  HENT:     Head: Normocephalic and atraumatic.     Right Ear: External ear normal.     Left Ear: External ear normal.  Eyes:     Extraocular Movements: Extraocular movements intact.     Pupils: Pupils are equal, round, and reactive to light.  Cardiovascular:     Rate and Rhythm: Normal rate and regular rhythm.     Heart sounds: Normal heart sounds. No murmur heard.    No gallop.  Pulmonary:     Effort: Pulmonary effort is normal. No respiratory distress.     Breath sounds: Normal breath sounds. No wheezing or rales.  Skin:    General: Skin is warm and  dry.  Neurological:      Mental Status: She is alert and oriented to person, place, and time.  Psychiatric:        Mood and Affect: Mood normal.        Behavior: Behavior normal.        Judgment: Judgment normal.     BP (!) 148/85 (BP Location: Left Arm, Patient Position: Sitting, Cuff Size: Large)   Pulse 82   Temp 98.7 F (37.1 C) (Oral)   Resp 16   Ht _0  (1.651 m)   Wt 200 lb (90.7 kg)   SpO2 98%   BMI 33.28 kg/m  Wt Readings from Last 3 Encounters:  08/16/22 200 lb (90.7 kg)  04/15/22 201 lb (91.2 kg)  12/31/21 200 lb 9.6 oz (91 kg)       Assessment & Plan:   Problem List Items Addressed This Visit       Unprioritized   HTN (hypertension)    BP above goal, increase losartan to 68m.      Relevant Medications   losartan (COZAAR) 50 MG tablet   GERD (gastroesophageal reflux disease)    Stable on omeprazole. Will continue same.       Controlled diabetes mellitus type 2 with complications ([facility] - Primary    Lab Results  Component Value Date   HGBA1C 6.5 04/15/2022   HGBA1C 6.3 12/31/2021   HGBA1C 6.5 09/24/2021   Lab Results  Component Value Date   MICROALBUR <0.7 09/05/2020   [place]87 09/24/2021   CREATININE 0.80 04/15/2022  Maintained on metformin.  Obtain A1c.       Relevant Medications   metFORMIN (GLUCOPHAGE) 500 MG tablet   losartan (COZAAR) 50 MG tablet   Other Relevant Orders   HgB A1c   Urine Microalbumin w/creat. ratio   Comp Met (CMET)   Meds ordered this encounter  Medications   metFORMIN (GLUCOPHAGE) 500 MG tablet    Sig: TAKE 2 TABLETS 2 TIMES     DAILY WITH MEALS.    Dispense:  360 tablet    Refill:  1    Order Specific Question:   Supervising Provider    Answer:   BPenni HomansA [[id number]]   losartan (COZAAR) 50 MG tablet    Sig: Take 1.5 tablets (75 mg total) by mouth daily.    Dispense:  135 tablet    Refill:  1    Order Specific Question:   Supervising Provider    Answer:   BPenni HomansA [[id number]]    I, MNance Pear NP, personally  preformed the services described in this documentation.  All medical record entries made by the scribe were at my direction and in my presence.  I have reviewed the chart and discharge instructions (if applicable) and agree that the record reflects my personal performance and is accurate and complete. 08/16/2022   I,Amber Collins,acting as a scribe for MNance Pear NP.,have documented all relevant documentation on the behalf of MNance Pear NP,as directed by  MNance Pear NP while in the presence of MNance Pear NP.    MNance Pear NP

## 2022-08-19 ENCOUNTER — Other Ambulatory Visit: Payer: Self-pay | Admitting: Family

## 2022-08-22 DIAGNOSIS — Z23 Encounter for immunization: Secondary | ICD-10-CM | POA: Diagnosis not present

## 2022-09-03 ENCOUNTER — Ambulatory Visit (INDEPENDENT_AMBULATORY_CARE_PROVIDER_SITE_OTHER): Payer: Medicare Other | Admitting: Family

## 2022-09-03 ENCOUNTER — Encounter: Payer: Self-pay | Admitting: Family

## 2022-09-03 ENCOUNTER — Other Ambulatory Visit: Payer: Self-pay | Admitting: Family

## 2022-09-03 VITALS — BP 162/84 | HR 83 | Temp 98.6°F | Resp 16 | Wt 202.0 lb

## 2022-09-03 DIAGNOSIS — I10 Essential (primary) hypertension: Secondary | ICD-10-CM | POA: Diagnosis not present

## 2022-09-03 DIAGNOSIS — Z1231 Encounter for screening mammogram for malignant neoplasm of breast: Secondary | ICD-10-CM

## 2022-09-03 LAB — BASIC METABOLIC PANEL
BUN: 13 mg/dL (ref 6–23)
CO2: 30 mEq/L (ref 19–32)
Calcium: 9.1 mg/dL (ref 8.4–10.5)
Chloride: 102 mEq/L (ref 96–112)
Creatinine, Ser: 0.73 mg/dL (ref 0.40–1.20)
GFR: 82.56 mL/min (ref >60.00)
Glucose, Bld: 88 mg/dL (ref 70–99)
Potassium: 4.6 mEq/L (ref 3.5–5.1)
Sodium: 140 mEq/L (ref 135–145)

## 2022-09-03 MED ORDER — LOSARTAN POTASSIUM 100 MG PO TABS: 100.0000 mg | ORAL_TABLET | Freq: Every day | ORAL | 1 refills | Status: DC

## 2022-09-03 NOTE — Progress Notes (Signed)
Subjective:   By signing my name below, I, Shehryar Baig, attest that this documentation has been prepared under the direction and in the presence of [name], NP. 09/03/2022   Patient ID: Elizabeth Vang, female    DOB: 1950/12/11, 72 y.o.   MRN: [id number]  Chief Complaint  Patient presents with   Hypertension    Here for follow up    Patient is in today for a follow up visit.   Blood pressure: Her losartan was increased from 50 mg to 75 mg. She reports her blood pressure has improved at home. Her highest reading at home was 150/58. Her blood pressure is elevated during this visit.  BP Readings from Last 3 Encounters:  09/03/22 (!) 162/84  08/16/22 (!) 148/85  04/15/22 132/81   Pulse Readings from Last 3 Encounters:  09/03/22 83  08/16/22 82  04/15/22 72    Past Medical History:  Diagnosis Date   Arthritis    Breast cancer ([place])    Cancer ([place]) 12/12/2000   breast   Diabetes ([place])    Environmental allergies    GERD (gastroesophageal reflux disease)    Hyperlipidemia    Hypertension    Neuropathy    fingers and toes   Osteoporosis     Past Surgical History:  Procedure Laterality Date   APPENDECTOMY  1982   BREAST LUMPECTOMY Right 12/12/00   lumpectomy / axillary node dissection, sentinel node bx (cancer)   GANGLION CYST EXCISION Right 1970   wrist   LAPAROSCOPIC SALPINGO OOPHERECTOMY  1984   benign tumor    Family History  Problem Relation Age of Onset   Hypertension Mother    Stroke Mother    Cancer Mother 43       uterine   Heart disease Mother        CHF   Lung disease Mother        interstital lung fibrosis   Diabetes Father    Lymphoma Father    Colon cancer Paternal Grandfather 41   Hypertension Brother    Diabetes type II Brother    Hypertension Sister        2 sisters   Diabetes Mellitus II Sister        2 sisters   Breast cancer Sister     Social History   Socioeconomic History   Marital status: Married    Spouse  name: Not on file   Number of children: Not on file   Years of education: Not on file   Highest education level: Not on file  Occupational History   Not on file  Tobacco Use   Smoking status: Never   Smokeless tobacco: Never  Substance and Sexual Activity   Alcohol use: Yes    Alcohol/week: 0.0 standard drinks of alcohol    Comment: rarely drinks wine   Drug use: Not on file   Sexual activity: Not on file  Other Topics Concern   Not on file  Social History Narrative   Lives with her husband and elderly mother   Has 2 sons- live locally, no grandchildren   Enjoys reading, travelling   Therapist, 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     atorvastatin (LIPITOR) 40 MG tablet TAKE 1 TABLET DAILY 90 tablet 1   Cholecalciferol (VITAMIN D3) 5000 UNIT/ML LIQD Take 5,000 Units by mouth 3 (three) times a week.     docusate sodium (COLACE) 100 MG capsule Take 100 mg by mouth 2 (two) times daily as needed.      Glucosamine HCl 1500 MG TABS Take 1 tablet by mouth daily.     glucose blood (ONETOUCH VERIO) test strip Use as instructed to check blood sugar once a day. DX E11.40 100 each 1   loratadine (CLARITIN) 10 MG tablet Take 10 mg by mouth daily.     Magnesium 250 MG TABS Take by mouth.     metFORMIN (GLUCOPHAGE) 500 MG tablet TAKE 2 TABLETS 2 TIMES     DAILY WITH MEALS. 360 tablet 1   Omega-3 Fatty Acids (OMEGA 3 PO) Take 1 capsule by mouth daily.     omeprazole (PRILOSEC) 40 MG capsule Take 1 capsule (40 mg total) by mouth daily. 90 capsule 1   OVER THE COUNTER MEDICATION Take 500 mg by  mouth daily. VITAMIN C /  ROSEHIPS     vitamin E 400 UNIT capsule Take 400 Units by mouth daily as needed.      losartan (COZAAR) 50 MG tablet Take 1.5 tablets (75 mg total) by mouth daily. 135 tablet 1   No facility-administered medications prior to visit.    Allergies  Allergen Reactions   Oxycodone-Acetaminophen     hallucinations    ROS    See HPI Objective:    Physical Exam Constitutional:      General: She is not in acute distress.    Appearance: Normal appearance. She is not ill-appearing.  HENT:     Head: Normocephalic and atraumatic.     Right Ear: External ear normal.     Left Ear: External ear normal.  Eyes:     Extraocular Movements: Extraocular movements intact.     Pupils: Pupils are equal, round, and reactive to light.  Cardiovascular:     Rate and Rhythm: Normal rate and regular rhythm.     Heart sounds: Normal heart sounds. No murmur heard.    No gallop.     Comments: Her blood pressure measured 150/85 during manual recheck Pulmonary:     Effort: Pulmonary effort is normal. No respiratory distress.     Breath sounds: Normal breath sounds. No wheezing or rales.  Skin:    General: Skin is warm and dry.  Neurological:     Mental Status: She is alert and oriented to person, place, and time.  Psychiatric:        Judgment: Judgment normal.     BP (!) 162/84 (BP Location: Left Arm, Patient Position: Sitting, Cuff Size: Large)   Pulse 83   Temp 98.6 F (37 C) (Oral)   Resp 16   Wt 202 lb (91.6 kg)   SpO2 98%   BMI 33.61 kg/m  Wt Readings from Last 3 Encounters:  09/03/22 202 lb (91.6 kg)  08/16/22 200 lb (90.7 kg)  04/15/22 201 lb (91.2 kg)       Assessment & Plan:  Primary hypertension Assessment & Plan: Uncontrolled on losartan '75mg'$ .  Will increase to '100mg'$ .  Repeat bmet today.  Orders: -     Basic metabolic panel  Other  orders -     Losartan Potassium; Take 1 tablet (100 mg total) by mouth daily.  Dispense: 90 tablet; Refill:  1    I, Nance Pear, NP, personally preformed the services described in this documentation.  All medical record entries made by the scribe were at my direction and in my presence.  I have reviewed the chart and discharge instructions (if applicable) and agree that the record reflects my personal performance and is accurate and complete. 09/03/2022   I,Shehryar Baig,acting as a Education administrator for Nance Pear, NP.,have documented all relevant documentation on the behalf of Nance Pear, NP,as directed by  Nance Pear, NP while in the presence of Nance Pear, NP.   Nance Pear, NP

## 2022-09-03 NOTE — Assessment & Plan Note (Signed)
Uncontrolled on losartan '75mg'$ .  Will increase to '100mg'$ .  Repeat bmet today.

## 2022-09-16 ENCOUNTER — Encounter: Payer: Self-pay | Admitting: Family

## 2022-09-16 ENCOUNTER — Ambulatory Visit (INDEPENDENT_AMBULATORY_CARE_PROVIDER_SITE_OTHER): Payer: Medicare Other | Admitting: Family

## 2022-09-16 VITALS — BP 152/90 | HR 84 | Temp 98.4°F | Resp 16 | Wt 204.0 lb

## 2022-09-16 DIAGNOSIS — I10 Essential (primary) hypertension: Secondary | ICD-10-CM

## 2022-09-16 LAB — BASIC METABOLIC PANEL
BUN: 11 mg/dL (ref 6–23)
CO2: 31 mEq/L (ref 19–32)
Calcium: 9.3 mg/dL (ref 8.4–10.5)
Chloride: 101 mEq/L (ref 96–112)
Creatinine, Ser: 0.65 mg/dL (ref 0.40–1.20)
GFR: 88.37 mL/min (ref >60.00)
Glucose, Bld: 88 mg/dL (ref 70–99)
Potassium: 4.8 mEq/L (ref 3.5–5.1)
Sodium: 140 mEq/L (ref 135–145)

## 2022-09-16 MED ORDER — HYDROCHLOROTHIAZIDE 25 MG PO TABS: 25.0000 mg | ORAL_TABLET | Freq: Every day | ORAL | 0 refills | Status: DC

## 2022-09-16 NOTE — Assessment & Plan Note (Signed)
BP Readings from Last 3 Encounters:  09/16/22 (!) 151/88  09/03/22 (!) 162/84  08/16/22 (!) 148/85   BP is improving but still above goal. Will continue losartan '100mg'$  and add hctz '25mg'$  once daily. She will follow up 1 week after she adds hctz.

## 2022-09-16 NOTE — Progress Notes (Signed)
Subjective:   By signing my name below, I, Shehryar Baig, attest that this documentation has been prepared under the direction and in the presence of [name], NP. 09/16/2022   Patient ID: Elizabeth Vang, female    DOB: Sep 13, 1950, 72 y.o.   MRN: [id number]  Chief Complaint  Patient presents with   Hypertension    Here for follow up    Hypertension   Patient is in today for a 2 week follow up visit.   Her blood pressure is elevated during this visit but improved since last visit. Her blood pressure was elevated during last visit as well so her losartan was increased to 100 mg. She has not taken any other blood pressure medication in the past.  BP Readings from Last 3 Encounters:  09/16/22 (!) 152/90  09/03/22 (!) 162/84  08/16/22 (!) 148/85   Pulse Readings from Last 3 Encounters:  09/16/22 84  09/03/22 83  08/16/22 82    Past Medical History:  Diagnosis Date   Arthritis    Breast cancer ([place])    Cancer ([place]) 12/12/2000   breast   Diabetes ([place])    Environmental allergies    GERD (gastroesophageal reflux disease)    Hyperlipidemia    Hypertension    Neuropathy    fingers and toes   Osteoporosis     Past Surgical History:  Procedure Laterality Date   APPENDECTOMY  1982   BREAST LUMPECTOMY Right 12/12/00   lumpectomy / axillary node dissection, sentinel node bx (cancer)   GANGLION CYST EXCISION Right 1970   wrist   LAPAROSCOPIC SALPINGO OOPHERECTOMY  1984   benign tumor    Family History  Problem Relation Age of Onset   Hypertension Mother    Stroke Mother    Cancer Mother 80       uterine   Heart disease Mother        CHF   Lung disease Mother        interstital lung fibrosis   Diabetes Father    Lymphoma Father    Colon cancer Paternal Grandfather 41   Hypertension Brother    Diabetes type II Brother    Hypertension Sister        2 sisters   Diabetes Mellitus II Sister        2 sisters   Breast cancer Sister     Social  History   Socioeconomic History   Marital status: Married    Spouse name: Not on file   Number of children: Not on file   Years of education: Not on file   Highest education level: Not on file  Occupational History   Not on file  Tobacco Use   Smoking status: Never   Smokeless tobacco: Never  Substance and Sexual Activity   Alcohol use: Yes    Alcohol/week: 0.0 standard drinks of alcohol    Comment: rarely drinks wine   Drug use: Not on file   Sexual activity: Not on file  Other Topics Concern   Not on file  Social History Narrative   Lives with her husband and elderly mother   Has 2 sons- live locally, no grandchildren   Enjoys reading, travelling   Therapist, 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     atorvastatin (LIPITOR) 40 MG tablet TAKE 1 TABLET DAILY 90 tablet 1   Cholecalciferol (VITAMIN D3) 5000 UNIT/ML LIQD Take 5,000 Units by mouth 3 (three) times a week.     docusate sodium (COLACE) 100 MG capsule Take 100 mg by mouth 2 (two) times daily as needed.      Glucosamine HCl 1500 MG TABS Take 1 tablet by mouth daily.     glucose blood (ONETOUCH VERIO) test strip Use as instructed to check blood sugar once a day. DX E11.40 100 each 1   loratadine (CLARITIN) 10 MG tablet Take 10 mg by mouth daily.     losartan (COZAAR) 100 MG tablet Take 1 tablet (100 mg total) by mouth daily. 90 tablet 1   Magnesium 250 MG TABS Take by mouth.     metFORMIN (GLUCOPHAGE) 500 MG tablet TAKE 2 TABLETS 2 TIMES     DAILY WITH MEALS. 360 tablet 1   Omega-3 Fatty Acids (OMEGA 3 PO) Take 1 capsule  by mouth daily.     omeprazole (PRILOSEC) 40 MG capsule Take 1 capsule (40 mg total) by mouth daily. 90 capsule 1   OVER THE COUNTER MEDICATION Take 500 mg by mouth daily. VITAMIN C /  ROSEHIPS     vitamin E 400 UNIT capsule Take 400 Units by mouth daily as needed.      No facility-administered medications prior to visit.    Allergies  Allergen Reactions   Oxycodone-Acetaminophen     hallucinations    ROS See HPI    Objective:    Physical Exam Constitutional:      General: She is not in acute distress.    Appearance: Normal appearance. She is not ill-appearing.  HENT:     Head: Normocephalic and atraumatic.     Right Ear: External ear normal.     Left Ear: External ear normal.  Eyes:     Extraocular Movements: Extraocular movements intact.     Pupils: Pupils are equal, round, and reactive to light.  Cardiovascular:     Rate and Rhythm: Normal rate and regular rhythm.     Heart sounds: Normal heart sounds. No murmur heard.    No gallop.     Comments: Blood pressure measured 152/90 during manual recheck Pulmonary:     Effort: Pulmonary effort is normal. No respiratory distress.     Breath sounds: Normal breath sounds. No wheezing or rales.  Skin:    General: Skin is warm and dry.  Neurological:     Mental Status: She is alert and oriented to person, place, and time.  Psychiatric:        Judgment: Judgment normal.     BP (!) 152/90   Pulse 84   Temp 98.4 F (36.9 C) (Oral)   Resp 16   Wt 204 lb (92.5 kg)   SpO2 100%   BMI 33.95 kg/m  Wt Readings from Last 3 Encounters:  09/16/22 204 lb (92.5 kg)  09/03/22 202 lb (91.6 kg)  08/16/22 200 lb (90.7 kg)       Assessment & Plan:  Primary hypertension Assessment & Plan: BP Readings from Last 3 Encounters:  09/16/22 (!) 151/88  09/03/22 (!) 162/84  08/16/22 (!) 148/85   BP  is improving but still above goal. Will continue losartan '100mg'$  and add hctz '25mg'$  once daily. She will follow up 1 week after she adds  hctz.   Orders: -     Basic metabolic panel  Other orders -     hydroCHLOROthiazide; Take 1 tablet (25 mg total) by mouth daily.  Dispense: 90 tablet; Refill: 0    I, Nance Pear, NP, personally preformed the services described in this documentation.  All medical record entries made by the scribe were at my direction and in my presence.  I have reviewed the chart and discharge instructions (if applicable) and agree that the record reflects my personal performance and is accurate and complete. 09/16/2022   I,Shehryar Baig,acting as a Education administrator for Nance Pear, NP.,have documented all relevant documentation on the behalf of Nance Pear, NP,as directed by  Nance Pear, NP while in the presence of Nance Pear, NP.   Nance Pear, NP

## 2022-09-16 NOTE — Patient Instructions (Signed)
Please add hctz '25mg'$  once daily.

## 2022-10-04 ENCOUNTER — Other Ambulatory Visit: Payer: Medicare Other | Admitting: Pharmacist

## 2022-10-04 DIAGNOSIS — I10 Essential (primary) hypertension: Secondary | ICD-10-CM

## 2022-10-04 NOTE — Progress Notes (Signed)
Patient appearing on report for [facility] - Hypertension Control report due to last documented ambulatory blood pressure of 152/90 on 09/16/2022. Next appointment with PCP is 10/07/2022  Outreached patient to discuss hypertension control and medication management.   Current antihypertensives: losartan 160m daily and hydrochlorothiazide 263mdaily (started 09/16/2022)  Patient has an automated upper arm home BP machine. She is recording blood pressure reading in her phone.  Current blood pressure readings: 106 to 130 / 68 to 80  Current meal patterns: following low sodium, diabetic diet.   Current physical activity: none - per patient difficulty to schedule due to 24 hour care needed for her husband.   Patient denies hypotensive signs and symptoms including no dizziness, lightheadedness.  Patient denies hypertensive symptoms including no headache, chest pain, shortness of breath.  Assessment/Plan: Hypertension - per recent home blood pressure improving.  - Reviewed goal blood pressure <130/80 - Reviewed to check blood pressure daily, document, and provide at next provider visit - Discussed dietary modifications, such as reduced salt intake, focus on whole grains, vegetables, lean proteins - Discussed increasing physical activity as able - Recommend continue losartan 10022maily and hydrochlorothiazide 22m31mily. Could use combo in future of losartan hydrochlorothiazide 100/22mg39mTammyCherre RobinsrmD Clinical Pharmacist [place] [place]

## 2022-10-07 ENCOUNTER — Ambulatory Visit (INDEPENDENT_AMBULATORY_CARE_PROVIDER_SITE_OTHER): Payer: Medicare Other | Admitting: Family

## 2022-10-07 ENCOUNTER — Ambulatory Visit: Payer: Medicare Other | Admitting: Family

## 2022-10-07 ENCOUNTER — Encounter: Payer: Self-pay | Admitting: Family

## 2022-10-07 VITALS — BP 139/85 | HR 88 | Temp 98.3°F | Resp 16 | Wt 199.0 lb

## 2022-10-07 DIAGNOSIS — I10 Essential (primary) hypertension: Secondary | ICD-10-CM | POA: Diagnosis not present

## 2022-10-07 LAB — BASIC METABOLIC PANEL
BUN: 20 mg/dL (ref 6–23)
CO2: 31 mEq/L (ref 19–32)
Calcium: 9.8 mg/dL (ref 8.4–10.5)
Chloride: 96 mEq/L (ref 96–112)
Creatinine, Ser: 0.84 mg/dL (ref 0.40–1.20)
GFR: 69.72 mL/min (ref >60.00)
Glucose, Bld: 87 mg/dL (ref 70–99)
Potassium: 4.5 mEq/L (ref 3.5–5.1)
Sodium: 137 mEq/L (ref 135–145)

## 2022-10-07 NOTE — Assessment & Plan Note (Signed)
BP stable/improved with addition of hctz. Continue along with losartan. Check follow up bmet today.

## 2022-10-07 NOTE — Progress Notes (Signed)
Subjective:     Patient ID: Elizabeth Vang, female    DOB: 1951-07-07, 72 y.o.   MRN: [id number]  Chief Complaint  Patient presents with   Hypertension    Here for follow up    Hypertension   Patient is in today for follow up of her blood pressure. Last visit we continued her losartan and added hctz 59m once daily. She is feeling well on the medications.  She brings with her today her home readings:     BP Readings from Last 3 Encounters:  10/07/22 139/85  09/16/22 (!) 152/90  09/03/22 (!) 162/84     Health Maintenance Due  Topic Date Due   Medicare Annual Wellness (AWV)  Never done   Zoster Vaccines- Shingrix (1 of 2) Never done    Past Medical History:  Diagnosis Date   Arthritis    [place]    [place]   [place]   Di[place] ([place]    Environmental allergies    GERD (gastroesophageal reflux disease)    Hyperlipidemia    Hypertension    Neuropathy    fingers and toes   Osteoporosis     Past Surgical History:  Procedure Laterality Date   APPENDECTOMY  1982   BREAST LUMPECTOMY Right 12/12/00   lumpectomy / axillary node dissection, sentinel node bx (cancer)   GANGLION CYST EXCISION Right 1970   wrist   LAPAROSCOPIC SALPINGO OOPHERECTOMY  1984   benign tumor    Family History  Problem Relation Age of Onset   Hypertension Mother    Stroke Mother    Cancer Mother 648      uterine   Heart disease Mother        CHF   Lung disease Mother        interstital lung fibrosis   Diabetes Father    Lymphoma Father    Colon cancer Paternal Grandfather 767  Hypertension Brother    Diabetes type II Brother    Hypertension Sister        2 sisters   Diabetes Mellitus II Sister        2 sisters   Breast cancer Sister     Social History   Socioeconomic History   Marital status: Married    Spouse name: Not on file   Number of children: Not on file   Years of education: Not on file   Highest education level: Not on file   Occupational History   Not on file  Tobacco Use   Smoking status: Never   Smokeless tobacco: Never  Substance and Sexual Activity   Alcohol use: Yes    Alcohol/week: 0.0 standard drinks of alcohol    Comment: rarely drinks wine   Drug use: Not on file   Sexual activity: Not on file  Other Topics Concern   Not on file  Social History Narrative   Lives with her husband and elderly mother   Has 2 sons- live locally, no grandchildren   Enjoys reading, travelling   RTherapist, 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     atorvastatin (LIPITOR) 40 MG tablet TAKE 1 TABLET DAILY 90 tablet 1   Cholecalciferol (VITAMIN D3) 5000 UNIT/ML LIQD Take 5,000 Units by mouth 3 (three) times a week.     docusate sodium (COLACE) 100 MG capsule Take 100 mg by mouth 2 (two) times daily as needed.      Glucosamine HCl 1500 MG TABS Take 1 tablet by mouth daily.     glucose blood (ONETOUCH VERIO) test strip Use as instructed to check blood sugar once a day. DX E11.40 100 each 1   hydrochlorothiazide (HYDRODIURIL) 25 MG tablet Take 1 tablet (25 mg total) by mouth daily. 90 tablet 0   loratadine (CLARITIN) 10 MG tablet Take 10 mg by mouth daily.     losartan (COZAAR) 100 MG tablet Take 1 tablet (100 mg total) by mouth daily. 90 tablet 1   Magnesium 250 MG TABS Take by mouth.     metFORMIN (GLUCOPHAGE) 500 MG tablet TAKE 2 TABLETS 2 TIMES     DAILY WITH MEALS. 360 tablet 1   Omega-3 Fatty Acids (OMEGA 3 PO) Take 1 capsule by mouth daily.     omeprazole (PRILOSEC) 40 MG capsule Take 1 capsule (40 mg total) by mouth  daily. 90 capsule 1   OVER THE COUNTER MEDICATION Take 500 mg by mouth daily. VITAMIN C /  ROSEHIPS     vitamin E 400 UNIT capsule Take 400 Units by mouth daily as needed.      No facility-administered medications prior to visit.    Allergies  Allergen Reactions   Oxycodone-Acetaminophen     hallucinations    ROS See HPI    Objective:    Physical Exam Constitutional:      General: She is not in acute distress.    Appearance: Normal appearance. She is well-developed.  HENT:     Head: Normocephalic and atraumatic.     Right Ear: External ear normal.     Left Ear: External ear normal.  Eyes:     General: No scleral icterus. Neck:     Thyroid: No thyromegaly.  Cardiovascular:     Rate and Rhythm: Normal rate and regular rhythm.     Heart sounds: Normal heart sounds. No murmur heard. Pulmonary:     Effort: Pulmonary effort is normal. No respiratory distress.     Breath sounds: Normal breath sounds. No wheezing.  Musculoskeletal:     Cervical back: Neck supple.  Skin:    General: Skin is warm and dry.  Neurological:     Mental Status: She is alert and oriented to person, place, and time.  Psychiatric:        Mood and Affect: Mood normal.        Behavior: Behavior normal.        Thought Content: Thought content normal.        Judgment: Judgment normal.     BP 139/85 (BP Location: Left Arm, Patient Position: Sitting, Cuff Size: Small)   Pulse 88   Temp 98.3 F (36.8 C) (Oral)   Resp 16   Wt 199 lb (90.3 kg)   SpO2 100%   BMI 33.12 kg/m  Wt Readings from Last 3 Encounters:  10/07/22 199 lb (90.3 kg)  09/16/22 204 lb (92.5 kg)  09/03/22 202 lb (91.6 kg)       Assessment & Plan:   Problem List Items Addressed This Visit  Unprioritized   HTN (hypertension) - Primary    BP stable/improved with addition of hctz. Continue along with losartan. Check follow up bmet today.      Relevant Orders   Basic Metabolic Panel (BMET)    I am having Elizabeth Vang maintain her docusate sodium, Glucosamine HCl, vitamin E, Omega-3 Fatty Acids (OMEGA 3 PO), loratadine, OVER THE COUNTER MEDICATION, aspirin EC, glucose blood, Vitamin D3, Magnesium, omeprazole, metFORMIN, atorvastatin, losartan, and hydrochlorothiazide.  No orders of the defined types were placed in this encounter.

## 2022-10-25 ENCOUNTER — Ambulatory Visit
Admission: RE | Admit: 2022-10-25 | Discharge: 2022-10-25 | Disposition: A | Discharge status: Home / Self Care | Payer: Medicare Other | Source: Ambulatory Visit | Attending: Family | Admitting: Family

## 2022-10-25 DIAGNOSIS — Z1231 Encounter for screening mammogram for malignant neoplasm of breast: Secondary | ICD-10-CM

## 2022-12-05 ENCOUNTER — Other Ambulatory Visit: Payer: Self-pay | Admitting: Family

## 2022-12-22 IMAGING — MG MM DIGITAL SCREENING BILAT W/ TOMO AND CAD
8 series · 8 of 24 positions shown · non-contrast
Comparison: Previous exam(s).

CLINICAL DATA: Screening.

EXAM:
DIGITAL SCREENING BILATERAL MAMMOGRAM WITH TOMOSYNTHESIS AND CAD
TECHNIQUE: Bilateral screening digital craniocaudal and mediolateral oblique
mammograms were obtained. Bilateral screening digital breast
tomosynthesis was performed. The images were evaluated with
computer-aided detection.

[R CC synth-2D]
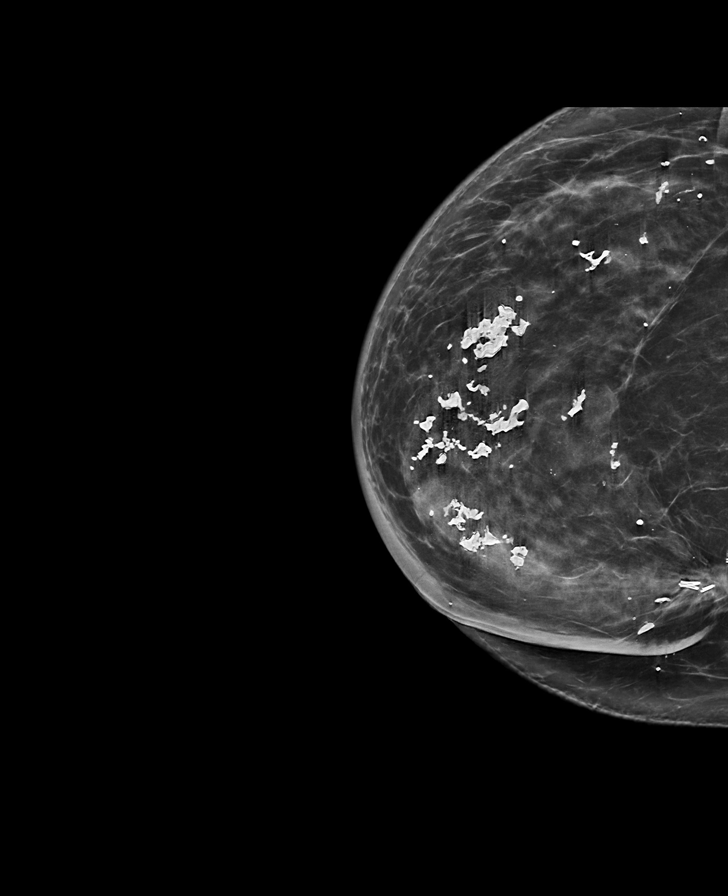

[R MLO synth-2D]
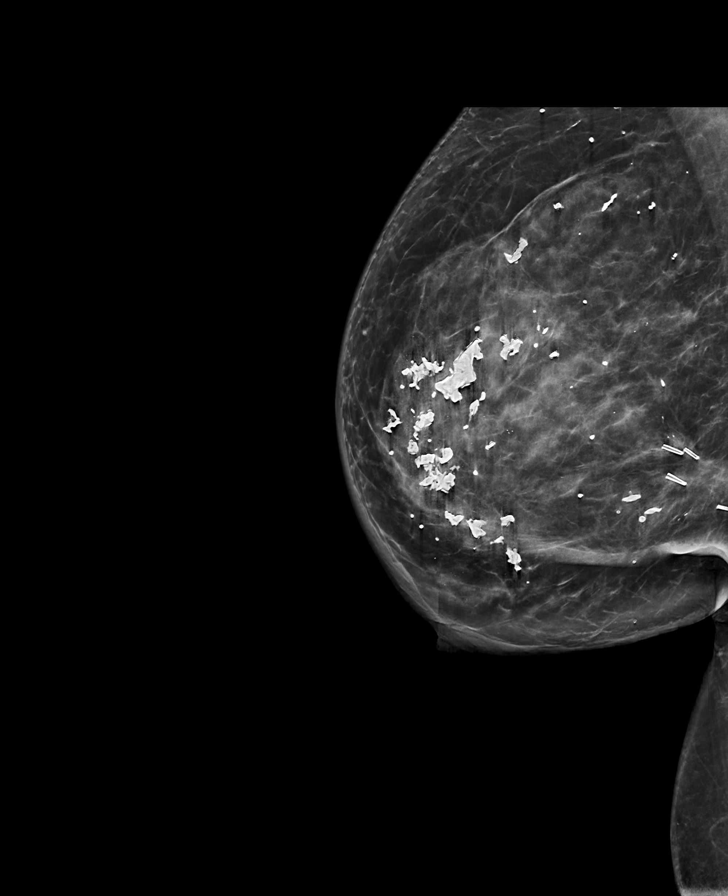

[L MLO synth-2D]
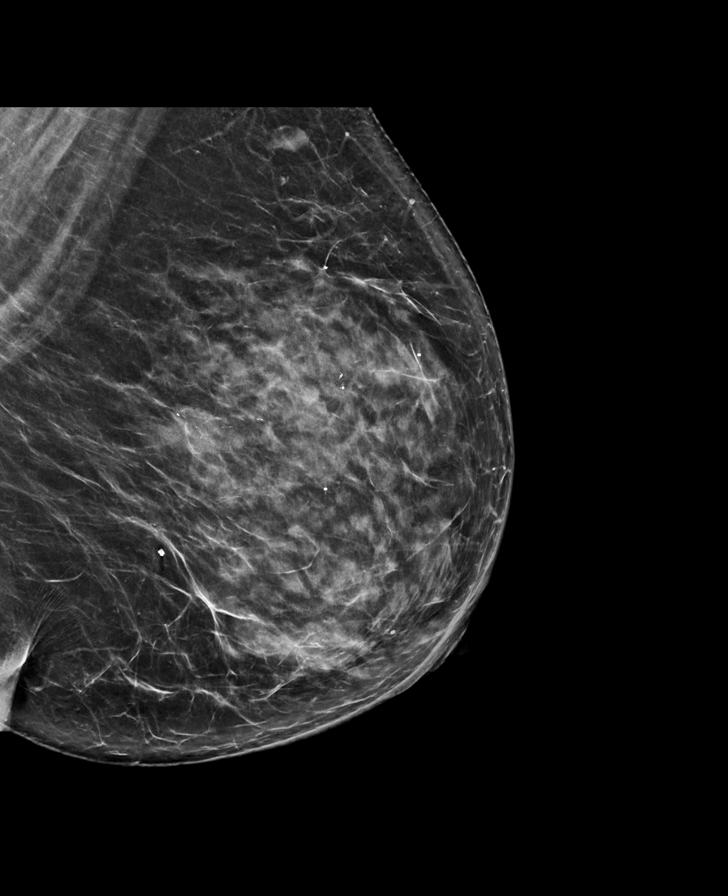

[L CC synth-2D]
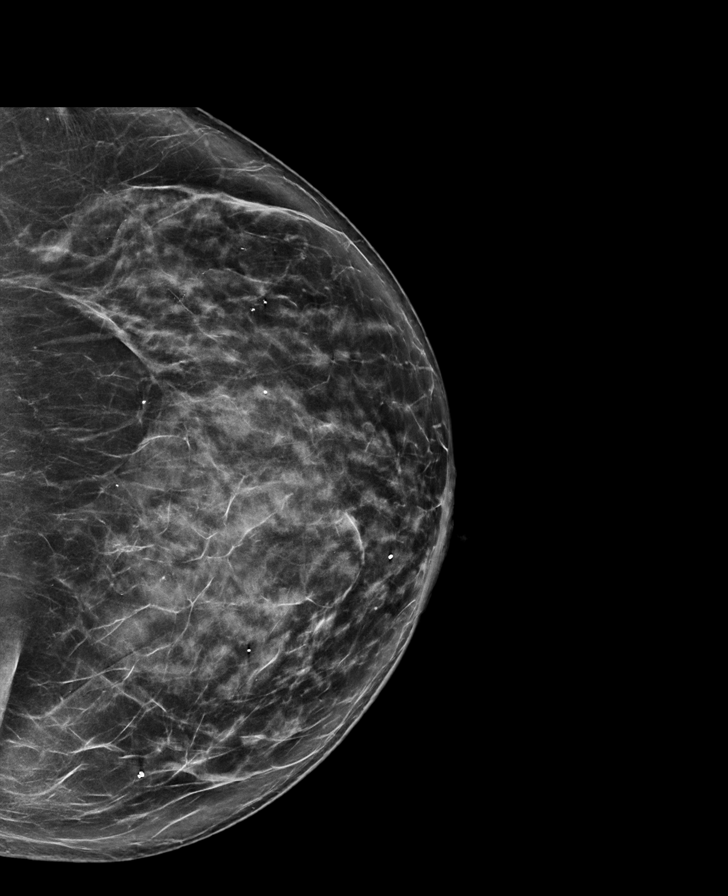

[L MLO tomo · tomo slice 43/85.0]
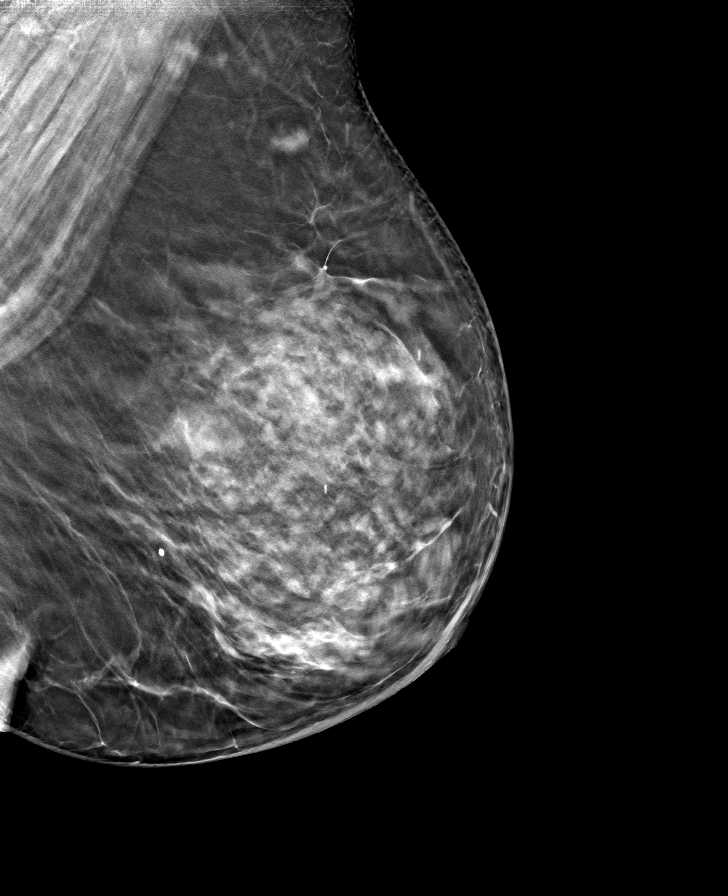

[R MLO tomo · tomo slice 43/85.0]
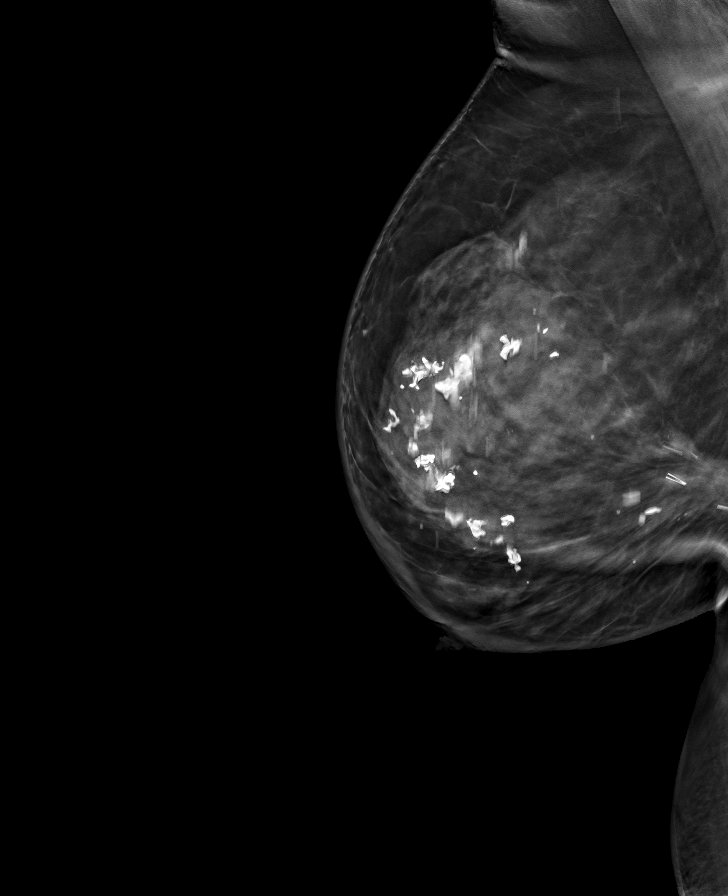

[R CC tomo · tomo slice 42/83.0]
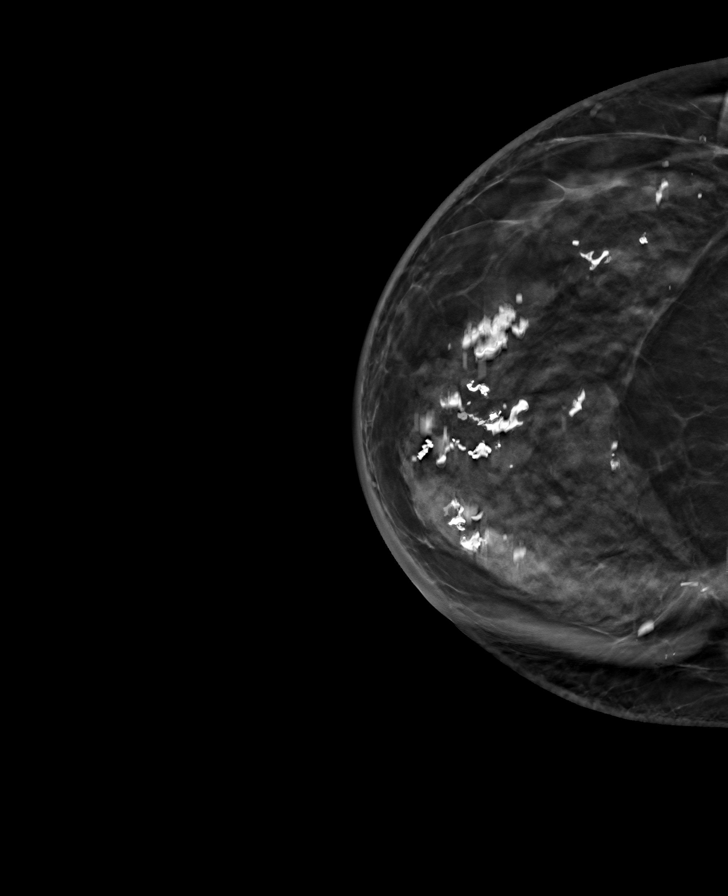

[L CC tomo · tomo slice 42/83.0]
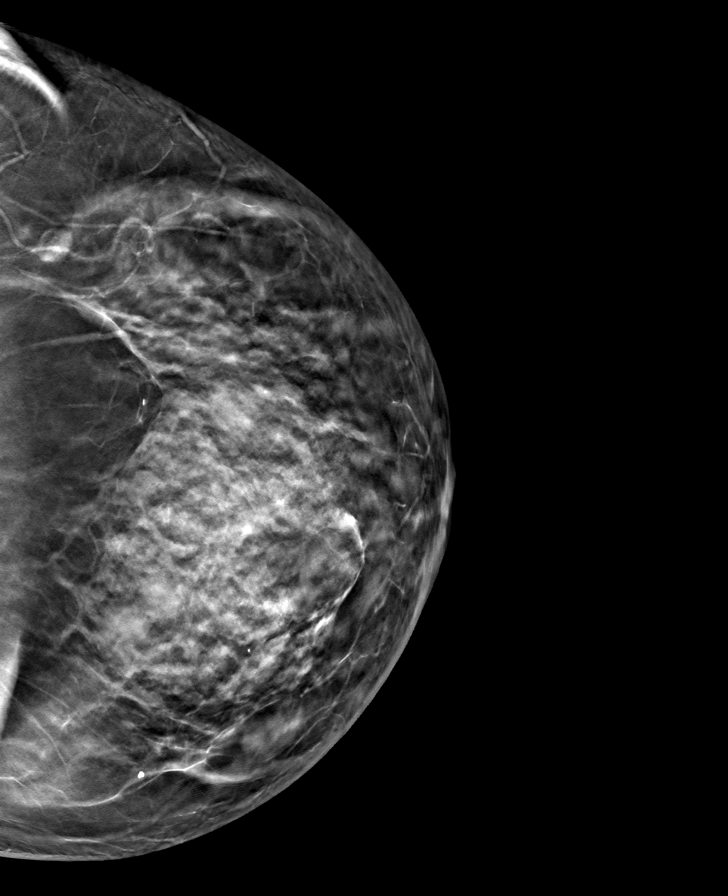

[8 of 24 positions shown; findings below may reference images not displayed]

ACR Breast Density Category c: The breast tissue is heterogeneously
dense, which may obscure small masses.
FINDINGS: There are no findings suspicious for malignancy.
IMPRESSION: No mammographic evidence of malignancy. A result letter of this
screening mammogram will be mailed directly to the patient.

RECOMMENDATION:
Screening mammogram in one year. (Code:Q3-W-BC3)

BI-RADS CATEGORY  1: Negative.

## 2023-01-10 ENCOUNTER — Other Ambulatory Visit: Payer: Self-pay | Admitting: Family

## 2023-02-05 ENCOUNTER — Ambulatory Visit: Payer: Medicare Other | Admitting: Family

## 2023-02-12 ENCOUNTER — Ambulatory Visit: Payer: Medicare Other | Admitting: Family

## 2023-02-20 ENCOUNTER — Other Ambulatory Visit: Payer: Self-pay | Admitting: Family

## 2023-02-21 ENCOUNTER — Ambulatory Visit (INDEPENDENT_AMBULATORY_CARE_PROVIDER_SITE_OTHER): Payer: Medicare Other | Admitting: Family

## 2023-02-21 VITALS — BP 122/78 | HR 78 | Temp 98.0°F | Wt 197.0 lb

## 2023-02-21 DIAGNOSIS — I10 Essential (primary) hypertension: Secondary | ICD-10-CM

## 2023-02-21 DIAGNOSIS — E785 Hyperlipidemia, unspecified: Secondary | ICD-10-CM

## 2023-02-21 DIAGNOSIS — K219 Gastro-esophageal reflux disease without esophagitis: Secondary | ICD-10-CM

## 2023-02-21 DIAGNOSIS — E118 Type 2 diabetes mellitus with unspecified complications: Secondary | ICD-10-CM | POA: Diagnosis not present

## 2023-02-21 DIAGNOSIS — Z7984 Long term (current) use of oral hypoglycemic drugs: Secondary | ICD-10-CM

## 2023-02-21 LAB — COMPREHENSIVE METABOLIC PANEL
ALT: 24 U/L (ref 0–35)
AST: 28 U/L (ref 0–37)
Albumin: 4.2 g/dL (ref 3.5–5.2)
Alkaline Phosphatase: 44 U/L (ref 39–117)
BUN: 20 mg/dL (ref 6–23)
CO2: 29 mEq/L (ref 19–32)
Calcium: 9.6 mg/dL (ref 8.4–10.5)
Chloride: 100 mEq/L (ref 96–112)
Creatinine, Ser: 0.89 mg/dL (ref 0.40–1.20)
GFR: 64.87 mL/min (ref >60.00)
Glucose, Bld: 92 mg/dL (ref 70–99)
Potassium: 4.1 mEq/L (ref 3.5–5.1)
Sodium: 137 mEq/L (ref 135–145)
Total Bilirubin: 0.4 mg/dL (ref 0.2–1.2)
Total Protein: 7.2 g/dL (ref 6.0–8.3)

## 2023-02-21 LAB — LIPID PANEL
Cholesterol: 126 mg/dL (ref 0–200)
HDL: 37.1 mg/dL — ABNORMAL LOW (ref >39.00)
LDL Cholesterol: 74 mg/dL (ref 0–99)
NonHDL: 89.11
Total CHOL/HDL Ratio: 3
Triglycerides: 78 mg/dL (ref 0.0–149.0)
VLDL: 15.6 mg/dL (ref 0.0–40.0)

## 2023-02-21 LAB — HEMOGLOBIN A1C: Hgb A1c MFr Bld: 6.2 % (ref 4.6–6.5)

## 2023-02-21 MED ORDER — HYDROCHLOROTHIAZIDE 25 MG PO TABS: 25.0000 mg | ORAL_TABLET | Freq: Every day | ORAL | 0 refills | Status: DC

## 2023-02-21 MED ORDER — LOSARTAN POTASSIUM 100 MG PO TABS
100.0000 mg | ORAL_TABLET | Freq: Every day | ORAL | 1 refills | Status: DC
Start: 2023-02-21 — End: 2023-06-23

## 2023-02-21 MED ORDER — ATORVASTATIN CALCIUM 40 MG PO TABS: 40.0000 mg | ORAL_TABLET | Freq: Every day | ORAL | 1 refills | Status: DC

## 2023-02-21 MED ORDER — METFORMIN HCL 500 MG PO TABS
1000.0000 mg | ORAL_TABLET | Freq: Two times a day (BID) | ORAL | 0 refills | Status: DC
Start: 2023-02-21 — End: 2023-05-09

## 2023-02-21 NOTE — Assessment & Plan Note (Signed)
Well controlled on current regimen of hydrochlorothiazide and losartan. Discussed the possibility of reducing losartan dose, but decided against it due to optimal blood pressure control. -Continue current regimen.

## 2023-02-21 NOTE — Assessment & Plan Note (Addendum)
Lab Results  Component Value Date   CHOL 142 09/24/2021   HDL 36.10 (L) 09/24/2021   LDLCALC 87 09/24/2021   TRIG 94.0 09/24/2021   CHOLHDL 4 09/24/2021  On atorvastatin, cholesterol not checked in a year. -Check lipid panel today. -Continue atorvastatin.

## 2023-02-21 NOTE — Progress Notes (Signed)
Subjective:     Patient ID: Elizabeth Vang, female    DOB: Jul 10, 1951, 72 y.o.   MRN: [id number]  Chief Complaint  Patient presents with   Hypertension    Here for follow up    Diabetes    Here for follow up    Hypertension  Diabetes    Discussed the use of AI scribe software for clinical note transcription with the patient, who gave verbal consent to proceed.  History of Present Illness   The patient, with a history of hypertension, diabetes, hyperlipidemia, and gastroesophageal reflux disease (GERD), presents for a routine follow-up. She reports that her blood pressure has been 'a lot, lot better' on her current regimen of hydrochlorothiazide and losartan. She denies any adverse effects from these medications.  Her last A1c, checked around Christmas, was 6.3, down from a previous level of 6.5. She reports adherence to her diet and is attempting to increase her physical activity. She recently purchased a recumbent exercise bike and is trying to use it twice daily, in addition to walking. However, she admits that her exercise level is not as high as it was previously, due to heat and allergies.  She is also on atorvastatin for hyperlipidemia and omeprazole for GERD. She takes the omeprazole daily and feels that she needs it most days. She could potentially skip a day without significant discomfort.  She also reports a history of allergies, which were particularly severe this past spring. She managed her symptoms with increased fluid intake, vitamin C, allergy medication twice daily, and occasional Tylenol.          Health Maintenance Due  Topic Date Due   Medicare Annual Wellness (AWV)  Never done   Zoster Vaccines- Shingrix (1 of 2) Never done   COVID-19 Vaccine (7 - 2023-24 season) 10/17/2022   HEMOGLOBIN A1C  02/15/2023    Past Medical History:  Diagnosis Date   Arthritis    Breast cancer (HCC)    Cancer (HCC) 12/12/2000   breast   Diabetes (HCC)     Environmental allergies    GERD (gastroesophageal reflux disease)    Hyperlipidemia    Hypertension    Neuropathy    fingers and toes   Osteoporosis     Past Surgical History:  Procedure Laterality Date   APPENDECTOMY  1982   BREAST LUMPECTOMY Right 12/12/00   lumpectomy / axillary node dissection, sentinel node bx (cancer)   GANGLION CYST EXCISION Right 1970   wrist   LAPAROSCOPIC SALPINGO OOPHERECTOMY  1984   benign tumor    Family History  Problem Relation Age of Onset   Hypertension Mother    Stroke Mother    Cancer Mother 58       uterine   Heart disease Mother        CHF   Lung disease Mother        interstital lung fibrosis   Diabetes Father    Lymphoma Father    Colon cancer Paternal Grandfather 62   Hypertension Brother    Diabetes type II Brother    Hypertension Sister        2 sisters   Diabetes Mellitus II Sister        2 sisters   Breast cancer Sister     Social History   Socioeconomic History   Marital status: Married    Spouse name: Not on file   Number of children: Not on file   Years of education: Not on file  Highest education level: Not on file  Occupational History   Not on file  Tobacco Use   Smoking status: Never   Smokeless tobacco: Never  Substance and Sexual Activity   Alcohol use: Yes    Alcohol/week: 0.0 standard drinks of alcohol    Comment: rarely drinks wine   Drug use: Not on file   Sexual activity: Not on file  Other Topics Concern   Not on file  Social History Narrative   Lives with her husband and elderly mother   Has 2 sons- live locally, no grandchildren   Enjoys reading, travelling   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     Cholecalciferol (VITAMIN D3) 5000 UNIT/ML LIQD Take 5,000 Units by mouth 3 (three) times a week.     docusate sodium (COLACE) 100 MG capsule Take 100 mg by mouth 2 (two) times daily as needed.      Glucosamine HCl 1500 MG TABS Take 1 tablet by mouth daily.     glucose blood (ONETOUCH VERIO) test strip Use as instructed to check blood sugar once a day. DX E11.40 100 each 1   loratadine (CLARITIN) 10 MG tablet Take 10 mg by mouth daily.     Magnesium 250 MG TABS Take by mouth.     Omega-3 Fatty Acids (OMEGA 3 PO) Take 1 capsule by mouth daily.     omeprazole (PRILOSEC) 40 MG capsule TAKE 1 CAPSULE DAILY 90 capsule 1   OVER THE COUNTER MEDICATION Take 500 mg by mouth daily. VITAMIN C /  ROSEHIPS     vitamin E 400 UNIT capsule Take 400 Units by mouth daily as needed.      atorvastatin (LIPITOR) 40 MG tablet TAKE 1 TABLET DAILY 90 tablet 1   hydrochlorothiazide (HYDRODIURIL) 25 MG tablet TAKE 1 TABLET DAILY 90 tablet 0   losartan (COZAAR) 100 MG tablet Take 1 tablet (100 mg total) by mouth daily. 90 tablet 1   metFORMIN (GLUCOPHAGE) 500 MG tablet Take 2 tablets (1,000 mg total) by mouth 2 (two) times daily with a meal. 360 tablet 0   No facility-administered medications prior to visit.    Allergies  Allergen Reactions   Oxycodone-Acetaminophen     hallucinations    ROS See HPI    Objective:    Physical Exam Constitutional:      General: She is not in acute distress.    Appearance: Normal appearance. She is well-developed.  HENT:     Head: Normocephalic and atraumatic.     Right Ear: External ear normal.     Left Ear: External ear normal.  Eyes:     General: No scleral icterus. Neck:     Thyroid: No thyromegaly.  Cardiovascular:     Rate and Rhythm: Normal rate and regular rhythm.     Heart sounds: Normal heart sounds. No murmur  heard. Pulmonary:     Effort: Pulmonary effort is normal. No respiratory distress.     Breath sounds: Normal breath sounds. No wheezing.  Musculoskeletal:  Cervical back: Neck supple.  Skin:    General: Skin is warm and dry.  Neurological:     Mental Status: She is alert and oriented to person, place, and time.  Psychiatric:        Mood and Affect: Mood normal.        Behavior: Behavior normal.        Thought Content: Thought content normal.        Judgment: Judgment normal.    Diabetic Foot Exam - Simple   Simple Foot Form Diabetic Foot exam was performed with the following findings: Yes 02/21/2023  8:18 AM  Visual Inspection No deformities, no ulcerations, no other skin breakdown bilaterally: Yes Sensation Testing Intact to touch and monofilament testing bilaterally: Yes Pulse Check Posterior Tibialis and Dorsalis pulse intact bilaterally: Yes Comments       BP 122/78 (BP Location: Left Arm, Patient Position: Sitting, Cuff Size: Large)   Pulse 78   Temp 98 F (36.7 C) (Oral)   Wt 197 lb (89.4 kg)   SpO2 99%   BMI 32.78 kg/m  Wt Readings from Last 3 Encounters:  02/21/23 197 lb (89.4 kg)  10/07/22 199 lb (90.3 kg)  09/16/22 204 lb (92.5 kg)       Assessment & Plan:   Problem List Items Addressed This Visit       Unprioritized   Hyperlipidemia - Primary    Lab Results  Component Value Date   CHOL 142 09/24/2021   HDL 36.10 (L) 09/24/2021   LDLCALC 87 09/24/2021   TRIG 94.0 09/24/2021   CHOLHDL 4 09/24/2021  On atorvastatin, cholesterol not checked in a year. -Check lipid panel today. -Continue atorvastatin.       Relevant Medications   losartan (COZAAR) 100 MG tablet   hydrochlorothiazide (HYDRODIURIL) 25 MG tablet   atorvastatin (LIPITOR) 40 MG tablet   Other Relevant Orders   Lipid panel   HTN (hypertension)    Well controlled on current regimen of hydrochlorothiazide and losartan. Discussed the possibility of reducing losartan dose, but  decided against it due to optimal blood pressure control. -Continue current regimen.       Relevant Medications   losartan (COZAAR) 100 MG tablet   hydrochlorothiazide (HYDRODIURIL) 25 MG tablet   atorvastatin (LIPITOR) 40 MG tablet   Other Relevant Orders   Comp Met (CMET)   Controlled diabetes mellitus type 2 with complications (HCC)    Last A1c was 6.3, improved from previous 6.5. Patient reports adherence to diet. -Check A1c today. -Continue current regimen of metformin.      Relevant Medications   metFORMIN (GLUCOPHAGE) 500 MG tablet   losartan (COZAAR) 100 MG tablet   atorvastatin (LIPITOR) 40 MG tablet   Other Relevant Orders   HgB A1c    I have changed Elizabeth Vang's hydrochlorothiazide and atorvastatin. I am also having her maintain her docusate sodium, Glucosamine HCl, vitamin E, Omega-3 Fatty Acids (OMEGA 3 PO), loratadine, OVER THE COUNTER MEDICATION, aspirin EC, glucose blood, Vitamin D3, Magnesium, omeprazole, metFORMIN, and losartan.  Meds ordered this encounter  Medications   metFORMIN (GLUCOPHAGE) 500 MG tablet    Sig: Take 2 tablets (1,000 mg total) by mouth 2 (two) times daily with a meal.    Dispense:  360 tablet    Refill:  0    Order Specific Question:   Supervising Provider    Answer:   Danise Edge A [[id number]]   losartan (COZAAR) 100 MG tablet    Sig: Take  1 tablet (100 mg total) by mouth daily.    Dispense:  90 tablet    Refill:  1    Order Specific Question:   Supervising Provider    Answer:   Danise Edge A [[id number]]   hydrochlorothiazide (HYDRODIURIL) 25 MG tablet    Sig: Take 1 tablet (25 mg total) by mouth daily.    Dispense:  90 tablet    Refill:  0    Order Specific Question:   Supervising Provider    Answer:   Danise Edge A [[id number]]   atorvastatin (LIPITOR) 40 MG tablet    Sig: Take 1 tablet (40 mg total) by mouth daily.    Dispense:  90 tablet    Refill:  1    Order Specific Question:   Supervising Provider    Answer:   Danise Edge A [[id number]]

## 2023-02-21 NOTE — Assessment & Plan Note (Signed)
Daily symptoms controlled with omeprazole. -Continue omeprazole daily.

## 2023-02-21 NOTE — Patient Instructions (Signed)
VISIT SUMMARY:  During your recent visit, we discussed your ongoing management of hypertension, diabetes, hyperlipidemia, and gastroesophageal reflux disease (GERD). You reported that your blood pressure has improved significantly with your current medication regimen. Your diabetes control is also good, with your last A1c level showing improvement. You are making efforts to increase your physical activity and maintain a healthy diet. Your cholesterol levels have not been checked in a year, so we will check that today. Your GERD symptoms are well controlled with daily omeprazole.  YOUR PLAN:  -HYPERTENSION: Hypertension, or high blood pressure, is well controlled with your current medications.   -TYPE 2 DIABETES MELLITUS: Your diabetes is well controlled, as shown by your last A1c level of 6.3, which is an improvement from the previous level. We will continue with your current treatment plan and check your A1c level today.  -HYPERLIPIDEMIA: Hyperlipidemia, or high cholesterol, is being managed with atorvastatin. We haven't checked your cholesterol levels in a year, so we will do that today.  -GASTROESOPHAGEAL REFLUX DISEASE (GERD): Your GERD, a condition where stomach acid frequently flows back into the tube connecting your mouth and stomach, is well controlled with daily omeprazole. We will continue with this medication.  INSTRUCTIONS:  We performed a foot exam during your visit. Your prescriptions for metformin, losartan, and omeprazole have been refilled. Please continue to take these medications as prescribed. We will follow up in four months to monitor your progress and make any necessary adjustments to your treatment plan.

## 2023-02-21 NOTE — Assessment & Plan Note (Signed)
Last A1c was 6.3, improved from previous 6.5. Patient reports adherence to diet. -Check A1c today. -Continue current regimen of metformin.

## 2023-05-09 ENCOUNTER — Other Ambulatory Visit: Payer: Self-pay | Admitting: Family

## 2023-05-09 DIAGNOSIS — E118 Type 2 diabetes mellitus with unspecified complications: Secondary | ICD-10-CM

## 2023-05-31 ENCOUNTER — Other Ambulatory Visit: Payer: Self-pay | Admitting: Family

## 2023-05-31 DIAGNOSIS — I10 Essential (primary) hypertension: Secondary | ICD-10-CM

## 2023-06-06 DIAGNOSIS — H1045 Other chronic allergic conjunctivitis: Secondary | ICD-10-CM | POA: Diagnosis not present

## 2023-06-06 DIAGNOSIS — H04123 Dry eye syndrome of bilateral lacrimal glands: Secondary | ICD-10-CM | POA: Diagnosis not present

## 2023-06-06 DIAGNOSIS — E119 Type 2 diabetes mellitus without complications: Secondary | ICD-10-CM | POA: Diagnosis not present

## 2023-06-06 DIAGNOSIS — H25813 Combined forms of age-related cataract, bilateral: Secondary | ICD-10-CM | POA: Diagnosis not present

## 2023-06-06 LAB — HM DIABETES EYE EXAM

## 2023-06-23 ENCOUNTER — Ambulatory Visit (INDEPENDENT_AMBULATORY_CARE_PROVIDER_SITE_OTHER): Payer: Medicare Other | Admitting: Family

## 2023-06-23 VITALS — BP 123/73 | HR 80 | Temp 99.3°F | Resp 16 | Ht 65.0 in | Wt 200.0 lb

## 2023-06-23 DIAGNOSIS — K219 Gastro-esophageal reflux disease without esophagitis: Secondary | ICD-10-CM | POA: Diagnosis not present

## 2023-06-23 DIAGNOSIS — Z23 Encounter for immunization: Secondary | ICD-10-CM | POA: Diagnosis not present

## 2023-06-23 DIAGNOSIS — I10 Essential (primary) hypertension: Secondary | ICD-10-CM

## 2023-06-23 DIAGNOSIS — R1319 Other dysphagia: Secondary | ICD-10-CM | POA: Diagnosis not present

## 2023-06-23 DIAGNOSIS — J301 Allergic rhinitis due to pollen: Secondary | ICD-10-CM

## 2023-06-23 DIAGNOSIS — M1990 Unspecified osteoarthritis, unspecified site: Secondary | ICD-10-CM | POA: Diagnosis not present

## 2023-06-23 DIAGNOSIS — E785 Hyperlipidemia, unspecified: Secondary | ICD-10-CM

## 2023-06-23 DIAGNOSIS — E118 Type 2 diabetes mellitus with unspecified complications: Secondary | ICD-10-CM | POA: Diagnosis not present

## 2023-06-23 LAB — BASIC METABOLIC PANEL
BUN: 17 mg/dL (ref 6–23)
CO2: 30 meq/L (ref 19–32)
Calcium: 9.3 mg/dL (ref 8.4–10.5)
Chloride: 99 meq/L (ref 96–112)
Creatinine, Ser: 0.91 mg/dL (ref 0.40–1.20)
GFR: 63.02 mL/min (ref >60.00)
Glucose, Bld: 90 mg/dL (ref 70–99)
Potassium: 4.4 meq/L (ref 3.5–5.1)
Sodium: 138 meq/L (ref 135–145)

## 2023-06-23 LAB — HEMOGLOBIN A1C: Hgb A1c MFr Bld: 6.8 % — ABNORMAL HIGH (ref 4.6–6.5)

## 2023-06-23 MED ORDER — LOSARTAN POTASSIUM 100 MG PO TABS: 50.0000 mg | ORAL_TABLET | Freq: Every day | ORAL | Status: DC

## 2023-06-23 NOTE — Assessment & Plan Note (Signed)
Declines referral to GI. Continue PPI.

## 2023-06-23 NOTE — Progress Notes (Signed)
Subjective:     Patient ID: Elizabeth Vang, female    DOB: 1951-07-18, 72 y.o.   MRN: [id number]  Chief Complaint  Patient presents with   Hypertension    Here for follow up, reports she is taking 1/2 tab (50 mg) due to dizziness    HPI  Discussed the use of AI scribe software for clinical note transcription with the patient, who gave verbal consent to proceed.  History of Present Illness   The patient, with a history of hyperlipidemia, arthritis, hypertension, and gastroesophageal reflux disease (GERD), presents for a routine follow-up. Her cholesterol was last checked in June and was well controlled on atorvastatin. She reports persistent arthritis pain, which is not improved with Tylenol, and she continues to manage it with naproxen.  The patient's blood pressure is well controlled at 123/73. She has self-adjusted her losartan dosage, taking half a pill on most days when her blood pressure is under 120. She continues to take hydrochlorothiazide.  Her GERD symptoms persist despite taking omeprazole. She reports less burning but has a sensation of throat closure and difficulty swallowing. She has not seen a GI specialist for these symptoms.  The patient's last A1c in June was 6.2, and she is managing her diabetes primarily through diet as she is not getting as much exercise as she used to. She uses a stationary bike for exercise.  She reports seasonal allergies, which have been worse this fall. She had a near sinus infection a month ago, which she managed with over-the-counter medications and hydration. She continues to take Claritin for allergy symptoms.          Health Maintenance Due  Topic Date Due   Medicare Annual Wellness (AWV)  Never done   Zoster Vaccines- Shingrix (1 of 2) Never done   INFLUENZA VACCINE  03/27/2023   COVID-19 Vaccine (7 - 2023-24 season) 04/27/2023    Past Medical History:  Diagnosis Date   Arthritis    Breast cancer (HCC)    Cancer (HCC)  12/12/2000   breast   Diabetes (HCC)    Environmental allergies    GERD (gastroesophageal reflux disease)    Hyperlipidemia    Hypertension    Neuropathy    fingers and toes   Osteoporosis     Past Surgical History:  Procedure Laterality Date   APPENDECTOMY  1982   BREAST LUMPECTOMY Right 12/12/00   lumpectomy / axillary node dissection, sentinel node bx (cancer)   GANGLION CYST EXCISION Right 1970   wrist   LAPAROSCOPIC SALPINGO OOPHERECTOMY  1984   benign tumor    Family History  Problem Relation Age of Onset   Hypertension Mother    Stroke Mother    Cancer Mother 49       uterine   Heart disease Mother        CHF   Lung disease Mother        interstital lung fibrosis   Diabetes Father    Lymphoma Father    Colon cancer Paternal Grandfather 30   Hypertension Brother    Diabetes type II Brother    Hypertension Sister        2 sisters   Diabetes Mellitus II Sister        2 sisters   Breast cancer Sister     Social History   Socioeconomic History   Marital status: Married    Spouse name: Not on file   Number of children: Not on file   Years  of education: Not on file   Highest education level: Not on file  Occupational History   Not on file  Tobacco Use   Smoking status: Never   Smokeless tobacco: Never  Substance and Sexual Activity   Alcohol use: Yes    Alcohol/week: 0.0 standard drinks of alcohol    Comment: rarely drinks wine   Drug use: Not on file   Sexual activity: Not on file  Other Topics Concern   Not on file  Social History Narrative   Lives with her husband and elderly mother   Has 2 sons- live locally, no grandchildren   Enjoys reading, travelling   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     atorvastatin (LIPITOR) 40 MG tablet Take 1 tablet (40 mg total) by mouth daily. 90 tablet 1   Cholecalciferol (VITAMIN D3) 5000 UNIT/ML LIQD Take 5,000 Units by mouth 3 (three) times a week.     docusate sodium (COLACE) 100 MG capsule Take 100 mg by mouth 2 (two) times daily as needed.      Glucosamine HCl 1500 MG TABS Take 1 tablet by mouth daily.     glucose blood (ONETOUCH VERIO) test strip Use as instructed to check blood sugar once a day. DX E11.40 100 each 1   hydrochlorothiazide (HYDRODIURIL) 25 MG tablet TAKE 1 TABLET DAILY 90 tablet 0   loratadine (CLARITIN) 10 MG tablet Take 10 mg by mouth daily.     Magnesium 250 MG TABS Take by mouth.     metFORMIN (GLUCOPHAGE) 500 MG tablet TAKE 2 TABLETS (1,000MG     TOTAL) TWO TIMES A DAY WITHMEALS 360 tablet 0   Omega-3 Fatty Acids (OMEGA 3 PO) Take 1 capsule by mouth daily.     omeprazole (PRILOSEC) 40 MG capsule TAKE 1 CAPSULE DAILY 90 capsule 1   OVER THE COUNTER MEDICATION Take 500 mg by mouth daily. VITAMIN C /  ROSEHIPS     vitamin E 400 UNIT capsule Take 400 Units by mouth daily as needed.      losartan (COZAAR) 100 MG tablet Take 1 tablet (100 mg total) by mouth daily. 90 tablet 1   No facility-administered medications prior to visit.    Allergies  Allergen Reactions   Oxycodone-Acetaminophen     hallucinations    ROS See HPI    Objective:    Physical Exam Constitutional:      General: She is not in acute distress.    Appearance: Normal appearance. She is well-developed.  HENT:     Head: Normocephalic and atraumatic.     Right Ear: External ear normal.     Left Ear: External ear normal.  Eyes:     General: No scleral icterus. Neck:     Thyroid: No thyromegaly.  Cardiovascular:     Rate and Rhythm: Normal rate and regular rhythm.     Heart sounds:  Normal heart sounds. No murmur heard. Pulmonary:     Effort: Pulmonary effort is normal. No respiratory distress.  Breath sounds: Normal breath sounds. No wheezing.  Musculoskeletal:     Cervical back: Neck supple.  Skin:    General: Skin is warm and dry.  Neurological:     Mental Status: She is alert and oriented to person, place, and time.  Psychiatric:        Mood and Affect: Mood normal.        Behavior: Behavior normal.        Thought Content: Thought content normal.        Judgment: Judgment normal.      BP 123/73 (BP Location: Left Arm, Patient Position: Sitting, Cuff Size: Large)   Pulse 80   Temp 99.3 F (37.4 C) (Oral)   Resp 16   Ht 5\' 5"  (1.651 m)   Wt 200 lb (90.7 kg)   SpO2 98%   BMI 33.28 kg/m  Wt Readings from Last 3 Encounters:  06/23/23 200 lb (90.7 kg)  02/21/23 197 lb (89.4 kg)  10/07/22 199 lb (90.3 kg)       Assessment & Plan:   Problem List Items Addressed This Visit       Unprioritized   Other dysphagia    Declines referral to GI. Continue PPI.       Osteoarthritis    Unchanged.  She uses naproxen sparingly.       Hyperlipidemia    Lab Results  Component Value Date   CHOL 126 02/21/2023   HDL 37.10 (L) 02/21/2023   LDLCALC 74 02/21/2023   TRIG 78.0 02/21/2023   CHOLHDL 3 02/21/2023   At goal, continue atorvastatin.       Relevant Medications   losartan (COZAAR) 100 MG tablet   HTN (hypertension)    BP Readings from Last 3 Encounters:  06/23/23 123/73  02/21/23 122/78  10/07/22 139/85         Relevant Medications   losartan (COZAAR) 100 MG tablet   GERD (gastroesophageal reflux disease)    Reports that her reflux is still present, continues to find omeprazole helpful.       Controlled diabetes mellitus type 2 with complications [facility]) - Primary    Lab Results  Component Value Date   HGBA1C 6.2 02/21/2023   HGBA1C 6.3 08/16/2022   HGBA1C 6.5 04/15/2022   Lab Results  Component Value Date   MICROALBUR 1.9  08/16/2022   LDLCALC 74 02/21/2023   CREATININE 0.89 02/21/2023   At goal, continue DM diet.       Relevant Medications   losartan (COZAAR) 100 MG tablet   Other Relevant Orders   HgB A1c   Basic Metabolic Panel (BMET)   Allergic rhinitis    Starting to improve.  Was worse about a month ago.  She continues claritin which she finds helpful.       Flu shot today.  I have changed Elizabeth Vang's losartan. I am also having her maintain her docusate sodium, Glucosamine HCl, vitamin E, Omega-3 Fatty Acids (OMEGA 3 PO), loratadine, OVER THE COUNTER MEDICATION, aspirin EC, glucose blood, Vitamin D3, Magnesium, omeprazole, atorvastatin, metFORMIN, and hydrochlorothiazide.  Meds ordered this encounter  Medications   losartan (COZAAR) 100 MG tablet    Sig: Take 0.5 tablets (50 mg total) by mouth daily.    Order Specific Question:   Supervising Provider    Answer:   Danise Edge A [[id number]]

## 2023-06-23 NOTE — Assessment & Plan Note (Signed)
Lab Results  Component Value Date   HGBA1C 6.2 02/21/2023   HGBA1C 6.3 08/16/2022   HGBA1C 6.5 04/15/2022   Lab Results  Component Value Date   MICROALBUR 1.9 08/16/2022   LDLCALC 74 02/21/2023   CREATININE 0.89 02/21/2023   At goal, continue DM diet.

## 2023-06-23 NOTE — Assessment & Plan Note (Signed)
Reports that her reflux is still present, continues to find omeprazole helpful.

## 2023-06-23 NOTE — Assessment & Plan Note (Signed)
Starting to improve.  Was worse about a month ago.  She continues claritin which she finds helpful.

## 2023-06-23 NOTE — Addendum Note (Signed)
Addended by: Wilford Corner on: 06/23/2023 10:01 AM   Modules accepted: Orders

## 2023-06-23 NOTE — Assessment & Plan Note (Signed)
Unchanged.  She uses naproxen sparingly.

## 2023-06-23 NOTE — Assessment & Plan Note (Signed)
BP Readings from Last 3 Encounters:  06/23/23 123/73  02/21/23 122/78  10/07/22 139/85

## 2023-06-23 NOTE — Assessment & Plan Note (Signed)
Lab Results  Component Value Date   CHOL 126 02/21/2023   HDL 37.10 (L) 02/21/2023   LDLCALC 74 02/21/2023   TRIG 78.0 02/21/2023   CHOLHDL 3 02/21/2023   At goal, continue atorvastatin.

## 2023-06-23 NOTE — Patient Instructions (Signed)
VISIT SUMMARY:  You came in today for a routine follow-up visit. We reviewed your history of hyperlipidemia, arthritis, hypertension, gastroesophageal reflux disease (GERD), diabetes, and seasonal allergies. Your cholesterol and blood pressure are well controlled, but you are experiencing persistent arthritis pain and GERD symptoms. We discussed your current medications and management strategies for each condition.  YOUR PLAN:  -HYPERLIPIDEMIA: Hyperlipidemia means you have high levels of fats (lipids) in your blood. Your cholesterol is well controlled with Atorvastatin, so you should continue taking it as prescribed.  -ARTHRITIS: Arthritis is inflammation of the joints, causing pain and stiffness. Your pain is persistent but not worsening. Continue taking Naproxen as needed for relief.  -HYPERTENSION: Hypertension is high blood pressure. Your blood pressure is well controlled with Losartan and Hydrochlorothiazide. Continue taking Losartan 50mg  daily and adjust the dose as needed based on your readings. Continue taking Hydrochlorothiazide as well.  -GASTROESOPHAGEAL REFLUX DISEASE (GERD): GERD is a condition where stomach acid frequently flows back into the tube connecting your mouth and stomach, causing discomfort. Your symptoms persist despite taking Omeprazole. Continue taking Omeprazole, and we may consider referring you to a GI specialist if symptoms worsen or persist.  -DIABETES: Diabetes is a condition that affects how your body processes blood sugar. Your last A1c was 6.2, indicating good control. We will check your A1c today and continue with your current management plan.  -ALLERGIC RHINITIS: Allergic rhinitis is an allergic reaction that causes sneezing, congestion, and a runny nose. Your symptoms are seasonal and managed with Claritin. Continue taking Claritin as needed.  -GENERAL HEALTH MAINTENANCE: We will administer the influenza vaccine today and recommend you get a COVID booster at  the pharmacy. We will also update your kidney function tests today. Follow up in 4 months.  INSTRUCTIONS:  Please follow up in 4 months. We will check your A1c and update your kidney function tests today. Additionally, please get a COVID booster at the pharmacy.

## 2023-07-29 ENCOUNTER — Telehealth: Payer: Self-pay | Admitting: Family

## 2023-07-29 MED ORDER — OMEPRAZOLE 40 MG PO CPDR: 40.0000 mg | DELAYED_RELEASE_CAPSULE | Freq: Every day | ORAL | 1 refills | Status: DC

## 2023-07-29 NOTE — Telephone Encounter (Signed)
Rx sent 

## 2023-07-29 NOTE — Addendum Note (Signed)
Addended by: Wilford Corner on: 07/29/2023 01:16 PM   Modules accepted: Orders

## 2023-07-29 NOTE — Telephone Encounter (Signed)
Prescription Request  07/29/2023  Is this a "Controlled Substance" medicine? No  LOV: 06/23/2023  What is the name of the medication or equipment? omeprazole (PRILOSEC) 40 MG capsule   Have you contacted your pharmacy to request a refill? Yes   Which pharmacy would you like this sent to?  [facility] - [place], [place] - One [facility] AT Portal to Registered [facility] Sites One [place] [address] Phone: [phone], option 2, ref#: [phone].    Patient notified that their request is being sent to the clinical staff for review and that they should receive a response within 2 business days.   Please advise at [facility] [phone]

## 2023-07-31 ENCOUNTER — Other Ambulatory Visit: Payer: Self-pay | Admitting: Family

## 2023-07-31 DIAGNOSIS — E785 Hyperlipidemia, unspecified: Secondary | ICD-10-CM

## 2023-07-31 DIAGNOSIS — E118 Type 2 diabetes mellitus with unspecified complications: Secondary | ICD-10-CM

## 2023-08-29 ENCOUNTER — Other Ambulatory Visit: Payer: Self-pay | Admitting: Family

## 2023-08-29 DIAGNOSIS — I10 Essential (primary) hypertension: Secondary | ICD-10-CM

## 2023-10-02 ENCOUNTER — Other Ambulatory Visit: Payer: Self-pay | Admitting: Family

## 2023-10-02 DIAGNOSIS — Z0000 Encounter for general adult medical examination without abnormal findings: Secondary | ICD-10-CM

## 2023-10-24 ENCOUNTER — Ambulatory Visit (INDEPENDENT_AMBULATORY_CARE_PROVIDER_SITE_OTHER): Payer: Medicare Other | Admitting: Family

## 2023-10-24 ENCOUNTER — Ambulatory Visit: Payer: Medicare Other | Admitting: Family

## 2023-10-24 VITALS — BP 133/81 | HR 80 | Temp 98.7°F | Resp 18 | Ht 65.0 in | Wt 199.2 lb

## 2023-10-24 DIAGNOSIS — E785 Hyperlipidemia, unspecified: Secondary | ICD-10-CM

## 2023-10-24 DIAGNOSIS — Z7984 Long term (current) use of oral hypoglycemic drugs: Secondary | ICD-10-CM | POA: Diagnosis not present

## 2023-10-24 DIAGNOSIS — J301 Allergic rhinitis due to pollen: Secondary | ICD-10-CM | POA: Diagnosis not present

## 2023-10-24 DIAGNOSIS — E118 Type 2 diabetes mellitus with unspecified complications: Secondary | ICD-10-CM

## 2023-10-24 DIAGNOSIS — I10 Essential (primary) hypertension: Secondary | ICD-10-CM

## 2023-10-24 DIAGNOSIS — K219 Gastro-esophageal reflux disease without esophagitis: Secondary | ICD-10-CM

## 2023-10-24 LAB — MICROALBUMIN / CREATININE URINE RATIO
Creatinine,U: 44.9 mg/dL
Microalb Creat Ratio: 15.6 mg/g (ref 0.0–30.0)
Microalb, Ur: <0.7 mg/dL (ref 0.0–1.9)

## 2023-10-24 LAB — BASIC METABOLIC PANEL
BUN: 17 mg/dL (ref 6–23)
CO2: 30 meq/L (ref 19–32)
Calcium: 9.5 mg/dL (ref 8.4–10.5)
Chloride: 97 meq/L (ref 96–112)
Creatinine, Ser: 0.89 mg/dL (ref 0.40–1.20)
GFR: 64.57 mL/min (ref >60.00)
Glucose, Bld: 87 mg/dL (ref 70–99)
Potassium: 4.4 meq/L (ref 3.5–5.1)
Sodium: 137 meq/L (ref 135–145)

## 2023-10-24 LAB — HEMOGLOBIN A1C: Hgb A1c MFr Bld: 6.6 % — ABNORMAL HIGH (ref 4.6–6.5)

## 2023-10-24 MED ORDER — ATORVASTATIN CALCIUM 40 MG PO TABS: 40.0000 mg | ORAL_TABLET | Freq: Every day | ORAL | 0 refills | Status: DC

## 2023-10-24 MED ORDER — METFORMIN HCL 500 MG PO TABS: 1000.0000 mg | ORAL_TABLET | Freq: Two times a day (BID) | ORAL | 0 refills | Status: DC

## 2023-10-24 MED ORDER — LOSARTAN POTASSIUM 100 MG PO TABS: 100.0000 mg | ORAL_TABLET | Freq: Every day | ORAL | 1 refills | Status: DC

## 2023-10-24 MED ORDER — OMEPRAZOLE 40 MG PO CPDR: 40.0000 mg | DELAYED_RELEASE_CAPSULE | Freq: Every day | ORAL | 0 refills | Status: DC

## 2023-10-24 MED ORDER — HYDROCHLOROTHIAZIDE 25 MG PO TABS
25.0000 mg | ORAL_TABLET | Freq: Every day | ORAL | 0 refills | Status: DC
Start: 2023-10-24 — End: 2023-12-12

## 2023-10-24 NOTE — Assessment & Plan Note (Signed)
 Taking omeprazole once daily. Finds this very helpful.

## 2023-10-24 NOTE — Assessment & Plan Note (Signed)
 Lab Results  Component Value Date   CHOL 126 02/21/2023   HDL 37.10 (L) 02/21/2023   LDLCALC 74 02/21/2023   TRIG 78.0 02/21/2023   CHOLHDL 3 02/21/2023   Lipids stable on atorvastatin, continue same.

## 2023-10-24 NOTE — Patient Instructions (Signed)
 VISIT SUMMARY:  Today, we reviewed your ongoing health conditions and medications. Your blood pressure, diabetes, cholesterol, and reflux are all well-controlled with your current medications. We also discussed your upcoming mammogram and general health maintenance.  YOUR PLAN:  -HYPERTENSION: Hypertension, or high blood pressure, is well-controlled with your current medication, Losartan 100mg  daily. You occasionally reduce the dose when you feel woozy. We will continue with your current regimen and provide a 90-day refill for Losartan and Hydrochlorothiazide.  -TYPE 2 DIABETES MELLITUS: Type 2 Diabetes Mellitus is a condition where your body does not use insulin properly. Your fasting blood glucose levels are well-managed with Metformin. We will check your A1C today and provide a 90-day refill for Metformin.  -HYPERLIPIDEMIA: Hyperlipidemia is a condition of having high cholesterol levels. Your cholesterol is well-controlled with Atorvastatin. We will continue with your current regimen and provide a 90-day refill.  -GASTROESOPHAGEAL REFLUX DISEASE (GERD): GERD is a condition where stomach acid frequently flows back into the tube connecting your mouth and stomach. Your symptoms are well-controlled with Omeprazole. We will continue with your current regimen and provide a 90-day refill.  -SEASONAL ALLERGIES: Seasonal allergies are allergic reactions that happen during certain times of the year. Your symptoms are controlled with daily Claritin. Continue taking Claritin as needed.  -BREAST CANCER HISTORY: You have a history of breast cancer and had a lumpectomy on your right breast. You are scheduled for a follow-up mammogram. [facility].  -GENERAL HEALTH MAINTENANCE: We will check your urine for protein today as part of your routine health maintenance. Follow up in 3 months for your next appointment.  INSTRUCTIONS:  Please have your A1C and urine protein  checked today. We will see you again in 3 months for your next follow-up appointment.

## 2023-10-24 NOTE — Assessment & Plan Note (Signed)
 Stable, continue losartan and hydrochlorothiazide.

## 2023-10-24 NOTE — Assessment & Plan Note (Signed)
 Lab Results  Component Value Date   HGBA1C 6.8 (H) 06/23/2023   Stable glucose at home, continue metformin, update A1C.

## 2023-10-24 NOTE — Progress Notes (Signed)
 Subjective:     Patient ID: Elizabeth Vang, female    DOB: 1951/02/03, 73 y.o.   MRN: [id number]  Chief Complaint  Patient presents with   Follow-up    4 month    HPI  Discussed the use of AI scribe software for clinical note transcription with the patient, who gave verbal consent to proceed.  History of Present Illness  Elizabeth Vang is a 73 year old female with hypertension and diabetes who presents for a routine follow-up visit.  She manages her hypertension with losartan 100 mg daily but occasionally reduces the dose to half a tablet when her blood pressure is in the upper nineties or low one hundreds, as she feels 'a little woozy.' She has experienced issues with medication supply and prefers a 90-day supply sent to [facility].  Her diabetes is managed with metformin, and her fasting blood sugar levels range from 90 to 120 mg/dL.  For hyperlipidemia, she takes atorvastatin, which was last checked over the summer and was reported to be well-controlled. She will continue with her current regimen.  She takes omeprazole daily for reflux, which helps her symptoms. She also continues to take Claritin year-round for allergies and plans to wear a mask as a preventive measure during allergy season.  She had a lumpectomy on the right breast and is scheduled for a mammogram on Monday at the [facility]. She mentions that it was a modified procedure where about half of the breast was removed.     Health Maintenance Due  Topic Date Due   Medicare Annual Wellness (AWV)  Never done   Zoster Vaccines- Shingrix (1 of 2) Never done   COVID-19 Vaccine (7 - 2024-25 season) 04/27/2023   Diabetic kidney evaluation - Urine ACR  08/17/2023   DTaP/Tdap/Td (2 - Td or Tdap) 09/18/2023    Past Medical History:  Diagnosis Date   Arthritis    Breast cancer (HCC)    Cancer (HCC) 12/12/2000   breast   Diabetes (HCC)    Environmental allergies    GERD (gastroesophageal  reflux disease)    Hyperlipidemia    Hypertension    Neuropathy    fingers and toes   Osteoporosis     Past Surgical History:  Procedure Laterality Date   APPENDECTOMY  1982   BREAST LUMPECTOMY Right 12/12/00   lumpectomy / axillary node dissection, sentinel node bx (cancer)   GANGLION CYST EXCISION Right 1970   wrist   LAPAROSCOPIC SALPINGO OOPHERECTOMY  1984   benign tumor    Family History  Problem Relation Age of Onset   Hypertension Mother    Stroke Mother    Cancer Mother 57       uterine   Heart disease Mother        CHF   Lung disease Mother        interstital lung fibrosis   Diabetes Father    Lymphoma Father    Colon cancer Paternal Grandfather 87   Hypertension Brother    Diabetes type II Brother    Hypertension Sister        2 sisters   Diabetes Mellitus II Sister        2 sisters   Breast cancer Sister     Social History   Socioeconomic History   Marital status: Married    Spouse name: Not on file   Number of children: Not on file   Years of education: Not on file   Highest  education level: Not on file  Occupational History   Not on file  Tobacco Use   Smoking status: Never   Smokeless tobacco: Never  Substance and Sexual Activity   Alcohol use: Yes    Alcohol/week: 0.0 standard drinks of alcohol    Comment: rarely drinks wine   Drug use: Not on file   Sexual activity: Not on file  Other Topics Concern   Not on file  Social History Narrative   Lives with her husband and elderly mother   Has 2 sons- live locally, no grandchildren   Enjoys reading, travelling   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     Cholecalciferol (VITAMIN D3) 5000 UNIT/ML LIQD Take 5,000 Units by mouth 3 (three) times a week.     docusate sodium (COLACE) 100 MG capsule Take 100 mg by mouth 2 (two) times daily as needed.      Glucosamine HCl 1500 MG TABS Take 1 tablet by mouth daily.     glucose blood (ONETOUCH VERIO) test strip Use as instructed to check blood sugar once a day. DX E11.40 100 each 1   loratadine (CLARITIN) 10 MG tablet Take 10 mg by mouth daily.     Magnesium 250 MG TABS Take by mouth.     Omega-3 Fatty Acids (OMEGA 3 PO) Take 1 capsule by mouth daily.     OVER THE COUNTER MEDICATION Take 500 mg by mouth daily. VITAMIN C /  ROSEHIPS     vitamin E 400 UNIT capsule Take 400 Units by mouth daily as needed.      atorvastatin (LIPITOR) 40 MG tablet TAKE 1 TABLET DAILY 90 tablet 1   hydrochlorothiazide (HYDRODIURIL) 25 MG tablet TAKE 1 TABLET DAILY 90 tablet 0   losartan (COZAAR) 100 MG tablet Take 0.5 tablets (50 mg total) by mouth daily.     metFORMIN (GLUCOPHAGE) 500 MG tablet TAKE 2 TABLETS (1,000MG     TOTAL) TWO TIMES A DAY WITHMEALS 360 tablet 1   omeprazole (PRILOSEC) 40 MG capsule Take 1 capsule (40 mg total) by mouth daily. 90 capsule 1   No facility-administered medications prior to visit.    Allergies  Allergen Reactions   Oxycodone-Acetaminophen     hallucinations    ROS See HPI    Objective:    Physical Exam Constitutional:      General: She is not in acute distress.    Appearance: Normal appearance. She is well-developed.  HENT:     Head: Normocephalic and atraumatic.     Right Ear: External ear normal.     Left Ear: External ear normal.  Eyes:     General: No scleral icterus. Neck:     Thyroid: No thyromegaly.  Cardiovascular:     Rate and Rhythm: Normal rate and regular rhythm.     Heart sounds: Normal heart sounds. No murmur heard. Pulmonary:     Effort: Pulmonary effort is normal. No respiratory  distress.     Breath sounds: Normal breath sounds. No wheezing.  Musculoskeletal:     Cervical back: Neck supple.  Skin:    General: Skin is warm and dry.  Neurological:     Mental Status: She is alert and oriented to person, place, and time.  Psychiatric:        Mood and Affect: Mood normal.        Behavior: Behavior normal.        Thought Content: Thought content normal.        Judgment: Judgment normal.      BP 133/81 (BP Location: Left Arm, Patient Position: Sitting, Cuff Size: Large)   Pulse 80   Temp 98.7 F (37.1 C) (Oral)   Resp 18   Ht 5\' 5"  (1.651 m)   Wt 199 lb 3.2 oz (90.4 kg)   SpO2 99%   BMI 33.15 kg/m  Wt Readings from Last 3 Encounters:  10/24/23 199 lb 3.2 oz (90.4 kg)  06/23/23 200 lb (90.7 kg)  02/21/23 197 lb (89.4 kg)       Assessment & Plan:   Problem List Items Addressed This Visit       Unprioritized   Hyperlipidemia   Lab Results  Component Value Date   CHOL 126 02/21/2023   HDL 37.10 (L) 02/21/2023   LDLCALC 74 02/21/2023   TRIG 78.0 02/21/2023   CHOLHDL 3 02/21/2023   Lipids stable on atorvastatin, continue same.       Relevant Medications   losartan (COZAAR) 100 MG tablet   atorvastatin (LIPITOR) 40 MG tablet   hydrochlorothiazide (HYDRODIURIL) 25 MG tablet   HTN (hypertension)   Stable, continue losartan and hydrochlorothiazide.       Relevant Medications   losartan (COZAAR) 100 MG tablet   atorvastatin (LIPITOR) 40 MG tablet   hydrochlorothiazide (HYDRODIURIL) 25 MG tablet   GERD (gastroesophageal reflux disease)   Taking omeprazole once daily. Finds this very helpful.       Relevant Medications   omeprazole (PRILOSEC) 40 MG capsule   Controlled diabetes mellitus type 2 with complications (HCC) - Primary   Lab Results  Component Value Date   HGBA1C 6.8 (H) 06/23/2023   Stable glucose at home, continue metformin, update A1C.       Relevant Medications   losartan (COZAAR) 100 MG tablet   metFORMIN (GLUCOPHAGE)  500 MG tablet   atorvastatin (LIPITOR) 40 MG tablet   Other Relevant Orders   HgB A1c   Basic Metabolic Panel (BMET)   Urine Microalbumin w/creat. ratio   Allergic rhinitis   Continues claritin daily year round. Currently stable.        I have changed Karina S. Lindell's losartan, metFORMIN, atorvastatin, and hydrochlorothiazide. I am also having her maintain her docusate sodium, Glucosamine HCl, vitamin E, Omega-3 Fatty Acids (OMEGA 3 PO), loratadine, OVER THE COUNTER MEDICATION, aspirin EC, glucose blood, Vitamin D3, Magnesium, and omeprazole.  Meds ordered this encounter  Medications   losartan (COZAAR) 100 MG tablet    Sig: Take 1 tablet (100 mg total) by mouth daily.    Dispense:  90 tablet    Refill:  1    Supervising Provider:   Danise Edge A [[id number]]   metFORMIN (GLUCOPHAGE) 500 MG tablet    Sig: Take 2 tablets (1,000 mg total) by mouth 2 (two) times daily with a meal.    Dispense:  360 tablet    Refill:  0    Supervising Provider:   Danise Edge A [[id number]]   atorvastatin (LIPITOR) 40 MG tablet  Sig: Take 1 tablet (40 mg total) by mouth daily.    Dispense:  90 tablet    Refill:  0    Supervising Provider:   Danise Edge A [[id number]]   hydrochlorothiazide (HYDRODIURIL) 25 MG tablet    Sig: Take 1 tablet (25 mg total) by mouth daily.    Dispense:  90 tablet    Refill:  0    Supervising Provider:   Danise Edge A [[id number]]   omeprazole (PRILOSEC) 40 MG capsule    Sig: Take 1 capsule (40 mg total) by mouth daily.    Dispense:  90 capsule    Refill:  0    Supervising Provider:   Danise Edge A [[id number]]

## 2023-10-24 NOTE — Assessment & Plan Note (Signed)
 Continues claritin daily year round. Currently stable.

## 2023-10-26 ENCOUNTER — Encounter: Payer: Self-pay | Admitting: Family

## 2023-10-27 ENCOUNTER — Ambulatory Visit
Admission: RE | Admit: 2023-10-27 | Discharge: 2023-10-27 | Disposition: A | Discharge status: Home / Self Care | Payer: Medicare Other | Source: Ambulatory Visit | Attending: Family | Admitting: Family

## 2023-10-27 DIAGNOSIS — Z0000 Encounter for general adult medical examination without abnormal findings: Secondary | ICD-10-CM

## 2023-10-27 DIAGNOSIS — Z1231 Encounter for screening mammogram for malignant neoplasm of breast: Secondary | ICD-10-CM | POA: Diagnosis not present

## 2023-11-27 ENCOUNTER — Other Ambulatory Visit: Payer: Self-pay | Admitting: Family

## 2023-11-27 DIAGNOSIS — I10 Essential (primary) hypertension: Secondary | ICD-10-CM

## 2023-12-12 ENCOUNTER — Other Ambulatory Visit: Payer: Self-pay | Admitting: Family

## 2023-12-12 DIAGNOSIS — I10 Essential (primary) hypertension: Secondary | ICD-10-CM

## 2023-12-12 NOTE — Telephone Encounter (Signed)
 Copied from CRM [id number]. Topic: Clinical - Medication Refill >> Dec 12, 2023 10:34 AM Allison Arena wrote: Most Recent Primary Care Visit:  Provider: O'SULLIVAN, MELISSA  Department: [facility]  Visit Type: OFFICE VISIT  Date: 10/24/2023  Medication: hydrochlorothiazide  (HYDRODIURIL ) 25 MG tablet   Has the patient contacted their pharmacy? Yes, pharmacy called in for a refill. (Agent: If no, request that the patient contact the pharmacy for the refill. If patient does not wish to contact the pharmacy document the reason why and proceed with request.) (Agent: If yes, when and what did the pharmacy advise?)  Is this the correct pharmacy for this prescription? Yes If no, delete pharmacy and type the correct one.  This is the patient's preferred pharmacy:  [facility] - [place], [place] - One [facility] AT Portal to Registered [facility] Sites [address] [place] [address] Phone: [phone] Fax: [phone]   Has the prescription been filled recently? No  Is the patient out of the medication? Pharmacy was unaware if patient is completely out of this medication.  Has the patient been seen for an appointment in the last year OR does the patient have an upcoming appointment? Yes  Can we respond through MyChart? Yes  Agent: Please be advised that Rx refills may take up to 3 business days. We ask that you follow-up with your pharmacy.

## 2023-12-15 MED ORDER — HYDROCHLOROTHIAZIDE 25 MG PO TABS: 25.0000 mg | ORAL_TABLET | Freq: Every day | ORAL | 0 refills | Status: DC

## 2024-01-21 ENCOUNTER — Ambulatory Visit (INDEPENDENT_AMBULATORY_CARE_PROVIDER_SITE_OTHER): Payer: Medicare Other | Admitting: Family

## 2024-01-21 VITALS — BP 126/81 | HR 84 | Temp 98.8°F | Resp 16 | Ht 65.0 in | Wt 201.0 lb

## 2024-01-21 DIAGNOSIS — E785 Hyperlipidemia, unspecified: Secondary | ICD-10-CM

## 2024-01-21 DIAGNOSIS — I10 Essential (primary) hypertension: Secondary | ICD-10-CM

## 2024-01-21 DIAGNOSIS — Z7984 Long term (current) use of oral hypoglycemic drugs: Secondary | ICD-10-CM | POA: Diagnosis not present

## 2024-01-21 DIAGNOSIS — E118 Type 2 diabetes mellitus with unspecified complications: Secondary | ICD-10-CM | POA: Diagnosis not present

## 2024-01-21 DIAGNOSIS — K219 Gastro-esophageal reflux disease without esophagitis: Secondary | ICD-10-CM | POA: Diagnosis not present

## 2024-01-21 LAB — BASIC METABOLIC PANEL WITH GFR
BUN: 20 mg/dL (ref 6–23)
CO2: 32 meq/L (ref 19–32)
Calcium: 9.5 mg/dL (ref 8.4–10.5)
Chloride: 98 meq/L (ref 96–112)
Creatinine, Ser: 0.94 mg/dL (ref 0.40–1.20)
GFR: 60.37 mL/min (ref >60.00)
Glucose, Bld: 95 mg/dL (ref 70–99)
Potassium: 4.9 meq/L (ref 3.5–5.1)
Sodium: 137 meq/L (ref 135–145)

## 2024-01-21 MED ORDER — METFORMIN HCL 500 MG PO TABS: 1000.0000 mg | ORAL_TABLET | Freq: Two times a day (BID) | ORAL | 0 refills | Status: DC

## 2024-01-21 MED ORDER — ATORVASTATIN CALCIUM 40 MG PO TABS: 40.0000 mg | ORAL_TABLET | Freq: Every day | ORAL | 0 refills | Status: DC

## 2024-01-21 MED ORDER — OMEPRAZOLE 40 MG PO CPDR: 40.0000 mg | DELAYED_RELEASE_CAPSULE | Freq: Every day | ORAL | 0 refills | Status: DC

## 2024-01-21 MED ORDER — HYDROCHLOROTHIAZIDE 25 MG PO TABS: 25.0000 mg | ORAL_TABLET | Freq: Every day | ORAL | 0 refills | Status: DC

## 2024-01-21 NOTE — Progress Notes (Signed)
 Subjective:     Patient ID: Elizabeth Vang, female    DOB: 08/05/1951, 73 y.o.   MRN: [id number]  Chief Complaint  Patient presents with   Hypertension    Here for follow up   Diabetes    Here for follow up    Hypertension  Diabetes    Discussed the use of AI scribe software for clinical note transcription with the patient, who gave verbal consent to proceed.  History of Present Illness   Elizabeth Vang is a 73 year old female with hypertension and gastroesophageal reflux disease who presents for a follow-up on her medications and lab work.  Her blood pressure remains stable, typically around 126/81 mmHg, occasionally dropping to 110/70 mmHg. She takes hydrochlorothiazide  and losartan  50 mg daily, increasing to 100 mg if her blood pressure rises to around 130 mmHg, which occurs about 25% of the time. Her gastroesophageal reflux disease is controlled with omeprazole , taken almost daily, alleviating symptoms of choking and throat constriction. Her A1c is 6.6%, and she is on metformin  for diabetes management. She uses [facility] on [address] for her prescriptions due to cost considerations. Lab Results  Component Value Date   HGBA1C 6.6 (H) 10/24/2023   HGBA1C 6.8 (H) 06/23/2023   HGBA1C 6.2 02/21/2023   Lab Results  Component Value Date   MICROALBUR <0.7 10/24/2023   LDLCALC 74 02/21/2023   CREATININE 0.89 10/24/2023      Health Maintenance Due  Topic Date Due   Medicare Annual Wellness (AWV)  Never done   Zoster Vaccines- Shingrix  (1 of 2) Never done   COVID-19 Vaccine (7 - 2024-25 season) 04/27/2023   DTaP/Tdap/Td (2 - Td or Tdap) 09/18/2023    Past Medical History:  Diagnosis Date   Arthritis    Breast cancer (HCC)    Cancer (HCC) 12/12/2000   breast   Diabetes (HCC)    Environmental allergies    GERD (gastroesophageal reflux disease)    Hyperlipidemia    Hypertension    Neuropathy    fingers and toes   Osteoporosis     Past  Surgical History:  Procedure Laterality Date   APPENDECTOMY  1982   BREAST LUMPECTOMY Right 12/12/00   lumpectomy / axillary node dissection, sentinel node bx (cancer)   GANGLION CYST EXCISION Right 1970   wrist   LAPAROSCOPIC SALPINGO OOPHERECTOMY  1984   benign tumor    Family History  Problem Relation Age of Onset   Hypertension Mother    Stroke Mother    Cancer Mother 30       uterine   Heart disease Mother        CHF   Lung disease Mother        interstital lung fibrosis   Diabetes Father    Lymphoma Father    Colon cancer Paternal Grandfather 10   Hypertension Brother    Diabetes type II Brother    Hypertension Sister        2 sisters   Diabetes Mellitus II Sister        2 sisters   Breast cancer Sister     Social History   Socioeconomic History   Marital status: Married    Spouse name: Not on file   Number of children: Not on file   Years of education: Not on file   Highest education level: Not on file  Occupational History   Not on file  Tobacco Use   Smoking status: Never  Smokeless tobacco: Never  Substance and Sexual Activity   Alcohol use: Yes    Alcohol/week: 0.0 standard drinks of alcohol    Comment: rarely drinks wine   Drug use: Not on file   Sexual activity: Not on file  Other Topics Concern   Not on file  Social History Narrative   Lives with her husband and elderly mother   Has 2 sons- live locally, no grandchildren   Enjoys reading, travelling   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     Cholecalciferol (VITAMIN D3) 5000 UNIT/ML  LIQD Take 5,000 Units by mouth 3 (three) times a week.     docusate sodium (COLACE) 100 MG capsule Take 100 mg by mouth 2 (two) times daily as needed.      Glucosamine HCl 1500 MG TABS Take 1 tablet by mouth daily.     glucose blood (ONETOUCH VERIO) test strip Use as instructed to check blood sugar once a day. DX E11.40 100 each 1   loratadine (CLARITIN) 10 MG tablet Take 10 mg by mouth daily.     losartan  (COZAAR ) 100 MG tablet Take 1 tablet (100 mg total) by mouth daily. 90 tablet 1   Magnesium 250 MG TABS Take by mouth.     Omega-3 Fatty Acids (OMEGA 3 PO) Take 1 capsule by mouth daily.     OVER THE COUNTER MEDICATION Take 500 mg by mouth daily. VITAMIN C /  ROSEHIPS     vitamin E 400 UNIT capsule Take 400 Units by mouth daily as needed.      atorvastatin  (LIPITOR) 40 MG tablet Take 1 tablet (40 mg total) by mouth daily. 90 tablet 0   hydrochlorothiazide  (HYDRODIURIL ) 25 MG tablet Take 1 tablet (25 mg total) by mouth daily. 90 tablet 0   metFORMIN  (GLUCOPHAGE ) 500 MG tablet Take 2 tablets (1,000 mg total) by mouth 2 (two) times daily with a meal. 360 tablet 0   omeprazole  (PRILOSEC) 40 MG capsule Take 1 capsule (40 mg total) by mouth daily. 90 capsule 0   No facility-administered medications prior to visit.    Allergies  Allergen Reactions   Oxycodone-Acetaminophen     hallucinations    ROS See HPI    Objective:     Physical Exam Constitutional:      General: She is not in acute distress.    Appearance: Normal appearance. She is well-developed.  HENT:     Head: Normocephalic and atraumatic.     Right Ear: External ear normal.     Left Ear: External ear normal.  Eyes:     General: No scleral icterus. Neck:     Thyroid : No thyromegaly.  Cardiovascular:     Rate and Rhythm: Normal rate and regular rhythm.     Heart sounds: Normal heart sounds. No murmur heard. Pulmonary:     Effort: Pulmonary effort is normal. No respiratory distress.     Breath sounds: Normal breath  sounds. No wheezing.  Musculoskeletal:     Cervical back: Neck supple.  Skin:    General: Skin is warm and dry.  Neurological:     Mental Status: She is alert and oriented to person, place, and time.  Psychiatric:        Mood and Affect: Mood normal.        Behavior: Behavior normal.        Thought Content: Thought content normal.        Judgment: Judgment normal.      BP 126/81 (BP Location: Left Arm, Patient Position: Sitting, Cuff Size: Large)   Pulse 84   Temp 98.8 F (37.1 C) (Oral)   Resp 16   Ht 5\' 5"  (1.651 m)   Wt 201 lb (91.2 kg)   SpO2 98%   BMI 33.45 kg/m  Wt Readings from Last 3 Encounters:  01/21/24 201 lb (91.2 kg)  10/24/23 199 lb 3.2 oz (90.4 kg)  06/23/23 200 lb (90.7 kg)       Assessment & Plan:   Problem List Items Addressed This Visit       Unprioritized   Hyperlipidemia   Lab Results  Component Value Date   CHOL 126 02/21/2023   HDL 37.10 (L) 02/21/2023   LDLCALC 74 02/21/2023   TRIG 78.0 02/21/2023   CHOLHDL 3 02/21/2023   Will update lipid panel, continue statin.      Relevant Medications   atorvastatin  (LIPITOR) 40 MG tablet   hydrochlorothiazide  (HYDRODIURIL ) 25 MG tablet   HTN (hypertension) - Primary   BP Readings from Last 3 Encounters:  01/21/24 126/81  10/24/23 133/81  06/23/23 123/73   Stable on hydrochlorothiazide  and losartan - usually only taking 50mg  of losartan .        Relevant Medications   atorvastatin  (LIPITOR) 40 MG tablet   hydrochlorothiazide  (HYDRODIURIL ) 25 MG tablet   GERD (gastroesophageal reflux disease)   Stable with omeprazole .  Takes most days, continue same.       Relevant Medications   omeprazole  (PRILOSEC) 40 MG capsule   Controlled diabetes mellitus type 2 with complications (HCC)   Lab Results  Component Value Date   HGBA1C 6.6 (H) 10/24/2023   A1C at goal on Metformin . Continue same. 89 days since last A1C- plan to repeat next time.       Relevant Medications   metFORMIN   (GLUCOPHAGE ) 500 MG tablet   atorvastatin  (LIPITOR) 40 MG tablet   Other Relevant Orders   Basic Metabolic Panel (BMET)    I am having Payden S. Gittleman maintain her docusate sodium, Glucosamine HCl, vitamin E, Omega-3 Fatty Acids (OMEGA 3 PO), loratadine, OVER THE COUNTER MEDICATION, aspirin  EC, glucose blood, Vitamin D3, Magnesium, losartan , metFORMIN , omeprazole , atorvastatin , and hydrochlorothiazide .  Meds ordered this encounter  Medications   metFORMIN  (GLUCOPHAGE ) 500 MG tablet    Sig: Take 2 tablets (1,000 mg total) by mouth 2 (two) times daily with a meal.    Dispense:  360 tablet    Refill:  0    Supervising Provider:   Randie Bustle A [[id number]]   omeprazole  (PRILOSEC) 40 MG capsule    Sig: Take 1 capsule (40 mg total) by mouth daily.    Dispense:  90 capsule    Refill:  0    Supervising Provider:   Randie Bustle A [[id number]]   atorvastatin  (LIPITOR) 40 MG tablet    Sig: Take 1 tablet (40 mg total) by mouth daily.    Dispense:  90 tablet    Refill:  0    Supervising Provider:   Randie Bustle A [  [id number]]   hydrochlorothiazide  (HYDRODIURIL ) 25 MG tablet    Sig: Take 1 tablet (25 mg total) by mouth daily.    Dispense:  90 tablet    Refill:  0    Supervising Provider:   Randie Bustle A [[id number]]

## 2024-01-21 NOTE — Patient Instructions (Signed)
 VISIT SUMMARY:  You had a follow-up visit to review your medications and lab work. Your blood pressure and diabetes are well-controlled, and your GERD symptoms are managed effectively with your current medications.  YOUR PLAN:  TYPE 2 DIABETES MELLITUS: Your A1c level is at 6.6%, indicating good control of your diabetes. -Continue your current diabetes management with metformin . -We will check your A1c at your next visit.  HYPERTENSION: Your blood pressure is well-controlled with your current medication regimen. -Continue taking losartan  50 mg daily. -Increase to 100 mg if your blood pressure rises.  HYPERLIPIDEMIA: Your cholesterol levels were good last year while on atorvastatin . -We will order a cholesterol panel. -Continue taking atorvastatin .  GASTROESOPHAGEAL REFLUX DISEASE (GERD): Your GERD symptoms are well-controlled with daily omeprazole . -Continue taking omeprazole  daily.

## 2024-01-21 NOTE — Assessment & Plan Note (Signed)
 BP Readings from Last 3 Encounters:  01/21/24 126/81  10/24/23 133/81  06/23/23 123/73   Stable on hydrochlorothiazide  and losartan - usually only taking 50mg  of losartan .

## 2024-01-21 NOTE — Assessment & Plan Note (Signed)
 Lab Results  Component Value Date   CHOL 126 02/21/2023   HDL 37.10 (L) 02/21/2023   LDLCALC 74 02/21/2023   TRIG 78.0 02/21/2023   CHOLHDL 3 02/21/2023   Will update lipid panel, continue statin.

## 2024-01-21 NOTE — Assessment & Plan Note (Signed)
 Stable with omeprazole .  Takes most days, continue same.

## 2024-01-21 NOTE — Assessment & Plan Note (Signed)
 Lab Results  Component Value Date   HGBA1C 6.6 (H) 10/24/2023   A1C at goal on Metformin . Continue same. 89 days since last A1C- plan to repeat next time.

## 2024-01-22 ENCOUNTER — Ambulatory Visit: Payer: Self-pay | Admitting: Family

## 2024-01-22 NOTE — Addendum Note (Signed)
 Addended by: Dorrene Gaucher on: 01/22/2024 02:25 PM   Modules accepted: Level of Service

## 2024-02-27 ENCOUNTER — Other Ambulatory Visit: Payer: Self-pay | Admitting: Family

## 2024-02-27 DIAGNOSIS — I10 Essential (primary) hypertension: Secondary | ICD-10-CM

## 2024-03-01 ENCOUNTER — Other Ambulatory Visit: Payer: Self-pay | Admitting: Family

## 2024-03-01 DIAGNOSIS — I10 Essential (primary) hypertension: Secondary | ICD-10-CM

## 2024-04-29 ENCOUNTER — Telehealth: Payer: Self-pay | Admitting: Family

## 2024-04-29 NOTE — Telephone Encounter (Signed)
 Copied from CRM [id number]. Topic: Medicare AWV >> Apr 29, 2024  9:50 AM Nathanel DEL wrote: Reason for CRM: Called LVM 04/29/2024 to schedule AWV. Please schedule office or virtual visits.  Nathanel Paschal; Care Guide Ambulatory Clinical Support [place] [facility] Direct Dial: [phone]

## 2024-04-30 ENCOUNTER — Encounter: Payer: Self-pay | Admitting: Family

## 2024-04-30 ENCOUNTER — Ambulatory Visit (INDEPENDENT_AMBULATORY_CARE_PROVIDER_SITE_OTHER): Admitting: Family

## 2024-04-30 VITALS — BP 137/77 | HR 84 | Temp 98.1°F | Resp 16 | Ht 65.0 in | Wt 198.2 lb

## 2024-04-30 DIAGNOSIS — I10 Essential (primary) hypertension: Secondary | ICD-10-CM | POA: Diagnosis not present

## 2024-04-30 DIAGNOSIS — Z23 Encounter for immunization: Secondary | ICD-10-CM | POA: Diagnosis not present

## 2024-04-30 DIAGNOSIS — K219 Gastro-esophageal reflux disease without esophagitis: Secondary | ICD-10-CM

## 2024-04-30 DIAGNOSIS — E118 Type 2 diabetes mellitus with unspecified complications: Secondary | ICD-10-CM

## 2024-04-30 DIAGNOSIS — E119 Type 2 diabetes mellitus without complications: Secondary | ICD-10-CM

## 2024-04-30 DIAGNOSIS — J301 Allergic rhinitis due to pollen: Secondary | ICD-10-CM

## 2024-04-30 DIAGNOSIS — Z7984 Long term (current) use of oral hypoglycemic drugs: Secondary | ICD-10-CM

## 2024-04-30 DIAGNOSIS — Z0000 Encounter for general adult medical examination without abnormal findings: Secondary | ICD-10-CM

## 2024-04-30 DIAGNOSIS — E785 Hyperlipidemia, unspecified: Secondary | ICD-10-CM | POA: Diagnosis not present

## 2024-04-30 LAB — LIPID PANEL
Cholesterol: 141 mg/dL (ref 0–200)
HDL: 36.1 mg/dL — ABNORMAL LOW (ref >39.00)
LDL Cholesterol: 88 mg/dL (ref 0–99)
NonHDL: 104.9
Total CHOL/HDL Ratio: 4
Triglycerides: 87 mg/dL (ref 0.0–149.0)
VLDL: 17.4 mg/dL (ref 0.0–40.0)

## 2024-04-30 LAB — COMPREHENSIVE METABOLIC PANEL WITH GFR
ALT: 25 U/L (ref 0–35)
AST: 29 U/L (ref 0–37)
Albumin: 4.3 g/dL (ref 3.5–5.2)
Alkaline Phosphatase: 56 U/L (ref 39–117)
BUN: 20 mg/dL (ref 6–23)
CO2: 32 meq/L (ref 19–32)
Calcium: 9.2 mg/dL (ref 8.4–10.5)
Chloride: 98 meq/L (ref 96–112)
Creatinine, Ser: 1.02 mg/dL (ref 0.40–1.20)
GFR: 54.62 mL/min — ABNORMAL LOW (ref >60.00)
Glucose, Bld: 91 mg/dL (ref 70–99)
Potassium: 4.8 meq/L (ref 3.5–5.1)
Sodium: 139 meq/L (ref 135–145)
Total Bilirubin: 0.4 mg/dL (ref 0.2–1.2)
Total Protein: 7.6 g/dL (ref 6.0–8.3)

## 2024-04-30 LAB — HEMOGLOBIN A1C: Hgb A1c MFr Bld: 6.9 % — ABNORMAL HIGH (ref 4.6–6.5)

## 2024-04-30 MED ORDER — ATORVASTATIN CALCIUM 40 MG PO TABS: 40.0000 mg | ORAL_TABLET | Freq: Every day | ORAL | 1 refills | Status: DC

## 2024-04-30 MED ORDER — LOSARTAN POTASSIUM 100 MG PO TABS: 100.0000 mg | ORAL_TABLET | Freq: Every day | ORAL | 1 refills | Status: DC

## 2024-04-30 MED ORDER — SHINGRIX 50 MCG/0.5ML IM SUSR: INTRAMUSCULAR | 1 refills | Status: DC

## 2024-04-30 MED ORDER — HYDROCHLOROTHIAZIDE 25 MG PO TABS
25.0000 mg | ORAL_TABLET | Freq: Every day | ORAL | 0 refills | Status: DC
Start: 2024-04-30 — End: 2024-05-24

## 2024-04-30 MED ORDER — OMEPRAZOLE 40 MG PO CPDR: 40.0000 mg | DELAYED_RELEASE_CAPSULE | Freq: Every day | ORAL | 1 refills | Status: AC

## 2024-04-30 MED ORDER — METFORMIN HCL 500 MG PO TABS: 1000.0000 mg | ORAL_TABLET | Freq: Two times a day (BID) | ORAL | 1 refills | Status: DC

## 2024-04-30 NOTE — Assessment & Plan Note (Signed)
 Lab Results  Component Value Date   CHOL 126 02/21/2023   HDL 37.10 (L) 02/21/2023   LDLCALC 74 02/21/2023   TRIG 78.0 02/21/2023   CHOLHDL 3 02/21/2023   Tolerating lipitor, update lipid panel.

## 2024-04-30 NOTE — Progress Notes (Addendum)
 Subjective:     Patient ID: Elizabeth Vang, female    DOB: 1951/08/24, 73 y.o.   MRN: [id number]  Chief Complaint  Patient presents with   Follow-up    No concerns Requesting flu vaccine today    HPI  Discussed the use of AI scribe software for clinical note transcription with the patient, who gave verbal consent to proceed.  History of Present Illness  Elizabeth Vang is a 73 year old female with type 2 diabetes, hyperlipidemia, and hypertension who presents for a routine follow-up and medication review.  She monitors her blood sugar at home, with readings ranging from the upper 80s to less than 120 mg/dL, and is currently taking metformin . She takes omeprazole  mostly every day to manage GERD symptoms, which return if she misses doses. She is on Lipitor 40 mg for cholesterol management, though it has been a while since her cholesterol levels were last checked. Her blood pressure is managed with losartan  100 mg and hydrochlorothiazide  25 mg. She has not yet received the shingles vaccine but is interested in getting it. She prefers to have her prescriptions sent to the [place] neighborhood [place].     Health Maintenance Due  Topic Date Due   Medicare Annual Wellness (AWV)  Never done   Zoster Vaccines- Shingrix  (1 of 2) Never done   DTaP/Tdap/Td (2 - Td or Tdap) 09/18/2023   COVID-19 Vaccine (7 - 2025-26 season) 04/26/2024    Past Medical History:  Diagnosis Date   Arthritis    Breast cancer (HCC)    Cancer (HCC) 12/12/2000   breast   Diabetes (HCC)    Environmental allergies    GERD (gastroesophageal reflux disease)    Hyperlipidemia    Hypertension    Neuropathy    fingers and toes   Osteoporosis     Past Surgical History:  Procedure Laterality Date   APPENDECTOMY  1982   BREAST LUMPECTOMY Right 12/12/00   lumpectomy / axillary node dissection, sentinel node bx (cancer)   GANGLION CYST EXCISION Right 1970   wrist   LAPAROSCOPIC SALPINGO  OOPHERECTOMY  1984   benign tumor    Family History  Problem Relation Age of Onset   Hypertension Mother    Stroke Mother    Cancer Mother 1       uterine   Heart disease Mother        CHF   Lung disease Mother        interstital lung fibrosis   Diabetes Father    Lymphoma Father    Colon cancer Paternal Grandfather 28   Hypertension Brother    Diabetes type II Brother    Hypertension Sister        2 sisters   Diabetes Mellitus II Sister        2 sisters   Breast cancer Sister     Social History   Socioeconomic History   Marital status: Married    Spouse name: Not on file   Number of children: Not on file   Years of education: Not on file   Highest education level: Not on file  Occupational History   Not on file  Tobacco Use   Smoking status: Never   Smokeless tobacco: Never  Substance and Sexual Activity   Alcohol use: Yes    Alcohol/week: 0.0 standard drinks of alcohol    Comment: rarely drinks wine   Drug use: Not on file   Sexual activity: Not on file  Other  Topics Concern   Not on file  Social History Narrative   Lives with her husband and elderly mother   Has 2 sons- live locally, no grandchildren   Enjoys reading, travelling   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     Cholecalciferol (VITAMIN D3) 5000 UNIT/ML LIQD Take 5,000 Units by mouth 3 (three) times a week.     docusate sodium (COLACE) 100 MG capsule Take 100 mg by mouth 2 (two) times daily as needed.      Glucosamine HCl 1500 MG TABS Take 1 tablet by mouth daily.     glucose blood  (ONETOUCH VERIO) test strip Use as instructed to check blood sugar once a day. DX E11.40 100 each 1   loratadine (CLARITIN) 10 MG tablet Take 10 mg by mouth daily.     Magnesium 250 MG TABS Take by mouth.     Omega-3 Fatty Acids (OMEGA 3 PO) Take 1 capsule by mouth daily.     OVER THE COUNTER MEDICATION Take 500 mg by mouth daily. VITAMIN C /  ROSEHIPS     vitamin E 400 UNIT capsule Take 400 Units by mouth daily as needed.      atorvastatin  (LIPITOR) 40 MG tablet Take 1 tablet (40 mg total) by mouth daily. 90 tablet 0   hydrochlorothiazide  (HYDRODIURIL ) 25 MG tablet TAKE 1 TABLET DAILY 90 tablet 0   losartan  (COZAAR ) 100 MG tablet Take 1 tablet by mouth once daily 90 tablet 0   metFORMIN  (GLUCOPHAGE ) 500 MG tablet Take 2 tablets (1,000 mg total) by mouth 2 (two) times daily with a meal. 360 tablet 0   omeprazole  (PRILOSEC) 40 MG capsule Take 1 capsule (40 mg total) by mouth daily. 90 capsule 0   No facility-administered medications prior to visit.    Allergies  Allergen Reactions   Oxycodone-Acetaminophen     hallucinations    ROS    See HPI Objective:    Physical Exam Constitutional:      General: She is not in acute distress.    Appearance: Normal appearance. She is well-developed.  HENT:     Head: Normocephalic and atraumatic.     Right Ear: External ear normal.     Left Ear: External ear normal.  Eyes:     General: No scleral icterus. Neck:     Thyroid : No thyromegaly.  Cardiovascular:     Rate and Rhythm: Normal rate and regular rhythm.     Heart sounds: Normal heart sounds. No murmur heard. Pulmonary:     Effort: Pulmonary effort is normal. No respiratory distress.     Breath sounds: Normal breath sounds. No wheezing.  Musculoskeletal:     Cervical back: Neck supple.  Skin:    General: Skin is warm and dry.  Neurological:     Mental Status: She is alert and oriented to person, place, and time.  Psychiatric:        Mood and Affect: Mood normal.         Behavior: Behavior normal.  Thought Content: Thought content normal.        Judgment: Judgment normal.    Diabetic Foot Exam - Simple   Simple Foot Form Diabetic Foot exam was performed with the following findings: Yes 04/30/2024  8:10 AM  Visual Inspection No deformities, no ulcerations, no other skin breakdown bilaterally: Yes Sensation Testing Intact to touch and monofilament testing bilaterally: Yes Pulse Check Posterior Tibialis and Dorsalis pulse intact bilaterally: Yes Comments       BP 137/77   Pulse 84   Temp 98.1 F (36.7 C) (Oral)   Resp 16   Ht 5' 5 (1.651 m)   Wt 198 lb 4 oz (89.9 kg)   SpO2 99%   BMI 32.99 kg/m  Wt Readings from Last 3 Encounters:  04/30/24 198 lb 4 oz (89.9 kg)  01/21/24 201 lb (91.2 kg)  10/24/23 199 lb 3.2 oz (90.4 kg)       Assessment & Plan:   Problem List Items Addressed This Visit       Unprioritized   Hyperlipidemia   Lab Results  Component Value Date   CHOL 126 02/21/2023   HDL 37.10 (L) 02/21/2023   LDLCALC 74 02/21/2023   TRIG 78.0 02/21/2023   CHOLHDL 3 02/21/2023   Tolerating lipitor, update lipid panel.       Relevant Medications   atorvastatin  (LIPITOR) 40 MG tablet   losartan  (COZAAR ) 100 MG tablet   hydrochlorothiazide  (HYDRODIURIL ) 25 MG tablet   Other Relevant Orders   Lipid panel (Completed)   HTN (hypertension)   BP Readings from Last 3 Encounters:  04/30/24 137/77  01/21/24 126/81  10/24/23 133/81   Stable on losartan  and hydrochlorothiazide .       Relevant Medications   atorvastatin  (LIPITOR) 40 MG tablet   losartan  (COZAAR ) 100 MG tablet   hydrochlorothiazide  (HYDRODIURIL ) 25 MG tablet   GERD (gastroesophageal reflux disease)   Stable on omeprazole , has recurrence of symptoms if she stops. Continue same.       Relevant Medications   omeprazole  (PRILOSEC) 40 MG capsule   Diabetes mellitus type 2, controlled, without complications (HCC) - Primary   Lab Results  Component Value  Date   HGBA1C 6.6 (H) 10/24/2023   HGBA1C 6.8 (H) 06/23/2023   HGBA1C 6.2 02/21/2023   Lab Results  Component Value Date   MICROALBUR <0.7 10/24/2023   LDLCALC 74 02/21/2023   CREATININE 0.94 01/21/2024   Stale on metformin  1000mg  bid.  Update A1C.       Relevant Medications   metFORMIN  (GLUCOPHAGE ) 500 MG tablet   atorvastatin  (LIPITOR) 40 MG tablet   losartan  (COZAAR ) 100 MG tablet   Other Relevant Orders   HgB A1c (Completed)   Comp Met (CMET) (Completed)   Allergic rhinitis   Stable on claritin.       Other Visit Diagnoses       Preventative health care       Relevant Medications   Zoster Vaccine Adjuvanted (SHINGRIX ) injection     Need for immunization against influenza       Relevant Orders   Flu vaccine HIGH DOSE PF(Fluzone Trivalent) (Completed)       I have changed Elizabeth Vang's losartan  and hydrochlorothiazide . I am also having her start on Shingrix . Additionally, I am having her maintain her docusate sodium, Glucosamine HCl, vitamin E, Omega-3 Fatty Acids (OMEGA 3 PO), loratadine, OVER THE COUNTER MEDICATION, aspirin  EC, glucose blood, Vitamin D3, Magnesium, metFORMIN , omeprazole , and atorvastatin .  Meds ordered this encounter  Medications   Zoster Vaccine Adjuvanted (SHINGRIX ) injection    Sig: Inject 0.5 ml now and repeat in 2-6 months    Dispense:  0.5 mL    Refill:  1    Supervising Provider:   DOMENICA BLACKBIRD A [[id number]]   metFORMIN  (GLUCOPHAGE ) 500 MG tablet    Sig: Take 2 tablets (1,000 mg total) by mouth 2 (two) times daily with a meal.    Dispense:  360 tablet    Refill:  1    Supervising Provider:   DOMENICA BLACKBIRD A [[id number]]   omeprazole  (PRILOSEC) 40 MG capsule    Sig: Take 1 capsule (40 mg total) by mouth daily.    Dispense:  90 capsule    Refill:  1    Supervising Provider:   DOMENICA BLACKBIRD A [[id number]]   atorvastatin  (LIPITOR) 40 MG tablet    Sig: Take 1 tablet (40 mg total) by mouth daily.    Dispense:  90 tablet    Refill:  1     Supervising Provider:   DOMENICA BLACKBIRD A [[id number]]   losartan  (COZAAR ) 100 MG tablet    Sig: Take 1 tablet (100 mg total) by mouth daily.    Dispense:  90 tablet    Refill:  1    Supervising Provider:   DOMENICA BLACKBIRD A [[id number]]   hydrochlorothiazide  (HYDRODIURIL ) 25 MG tablet    Sig: Take 1 tablet (25 mg total) by mouth daily.    Dispense:  90 tablet    Refill:  0    Supervising Provider:   DOMENICA BLACKBIRD A [[id number]]

## 2024-04-30 NOTE — Assessment & Plan Note (Signed)
Stable on claritin.  

## 2024-04-30 NOTE — Assessment & Plan Note (Signed)
 Stable on omeprazole , has recurrence of symptoms if she stops. Continue same.

## 2024-04-30 NOTE — Patient Instructions (Signed)
 VISIT SUMMARY:  Today, we reviewed your medications and overall health, including your diabetes, blood pressure, cholesterol, and GERD. We also discussed your interest in the shingles vaccine.  YOUR PLAN:  TYPE 2 DIABETES MELLITUS: Your blood sugar levels are well-controlled, ranging from 80-120 mg/dL. -We will order an A1c test to check your average blood sugar levels over the past 3 months. -Your metformin  prescription will be sent to the [place] neighborhood [place].  HYPERTENSION: Your blood pressure is well-controlled at 137/77 mmHg with your current medications. -Continue taking losartan  and hydrochlorothiazide  as prescribed. -Your prescriptions for losartan  and hydrochlorothiazide  will be sent to the [place] neighborhood [place].  HYPERLIPIDEMIA: Your cholesterol is managed with Lipitor, but it has been a while since your last cholesterol check. -We will order a cholesterol test to check your levels. -Your Lipitor prescription will be sent to the [place] neighborhood [place].  GASTROESOPHAGEAL REFLUX DISEASE (GERD): Your GERD symptoms are well-managed with omeprazole , but they return if you miss doses. -Your omeprazole  prescription will be sent to the [place] neighborhood [place].  SHINGLES VACCINE: You are interested in receiving the shingles vaccine. -We will arrange for you to receive the shingles vaccine.

## 2024-04-30 NOTE — Assessment & Plan Note (Signed)
 BP Readings from Last 3 Encounters:  04/30/24 137/77  01/21/24 126/81  10/24/23 133/81   Stable on losartan  and hydrochlorothiazide .

## 2024-04-30 NOTE — Assessment & Plan Note (Signed)
 Lab Results  Component Value Date   HGBA1C 6.6 (H) 10/24/2023   HGBA1C 6.8 (H) 06/23/2023   HGBA1C 6.2 02/21/2023   Lab Results  Component Value Date   MICROALBUR <0.7 10/24/2023   LDLCALC 74 02/21/2023   CREATININE 0.94 01/21/2024   Stale on metformin  1000mg  bid.  Update A1C.

## 2024-05-03 ENCOUNTER — Ambulatory Visit: Payer: Self-pay | Admitting: Family

## 2024-05-23 ENCOUNTER — Other Ambulatory Visit: Payer: Self-pay | Admitting: Family

## 2024-05-23 DIAGNOSIS — I10 Essential (primary) hypertension: Secondary | ICD-10-CM

## 2024-06-14 ENCOUNTER — Encounter: Payer: Self-pay | Admitting: *Deleted

## 2024-06-14 DIAGNOSIS — H25813 Combined forms of age-related cataract, bilateral: Secondary | ICD-10-CM | POA: Diagnosis not present

## 2024-06-14 DIAGNOSIS — E119 Type 2 diabetes mellitus without complications: Secondary | ICD-10-CM | POA: Diagnosis not present

## 2024-06-14 DIAGNOSIS — H04123 Dry eye syndrome of bilateral lacrimal glands: Secondary | ICD-10-CM | POA: Diagnosis not present

## 2024-06-14 LAB — HM DIABETES EYE EXAM

## 2024-08-03 ENCOUNTER — Other Ambulatory Visit (HOSPITAL_BASED_OUTPATIENT_CLINIC_OR_DEPARTMENT_OTHER): Payer: Self-pay

## 2024-08-03 ENCOUNTER — Ambulatory Visit: Admitting: Family

## 2024-08-03 VITALS — BP 134/83 | HR 89 | Temp 98.1°F | Resp 16 | Ht 65.0 in | Wt 201.0 lb

## 2024-08-03 DIAGNOSIS — J301 Allergic rhinitis due to pollen: Secondary | ICD-10-CM | POA: Diagnosis not present

## 2024-08-03 DIAGNOSIS — K219 Gastro-esophageal reflux disease without esophagitis: Secondary | ICD-10-CM | POA: Diagnosis not present

## 2024-08-03 DIAGNOSIS — E785 Hyperlipidemia, unspecified: Secondary | ICD-10-CM | POA: Diagnosis not present

## 2024-08-03 DIAGNOSIS — I10 Essential (primary) hypertension: Secondary | ICD-10-CM | POA: Diagnosis not present

## 2024-08-03 DIAGNOSIS — E119 Type 2 diabetes mellitus without complications: Secondary | ICD-10-CM

## 2024-08-03 DIAGNOSIS — Z7984 Long term (current) use of oral hypoglycemic drugs: Secondary | ICD-10-CM | POA: Diagnosis not present

## 2024-08-03 LAB — HEMOGLOBIN A1C: Hgb A1c MFr Bld: 6.3 % (ref 4.6–6.5)

## 2024-08-03 LAB — BASIC METABOLIC PANEL WITH GFR
BUN: 23 mg/dL (ref 6–23)
CO2: 30 meq/L (ref 19–32)
Calcium: 9.6 mg/dL (ref 8.4–10.5)
Chloride: 97 meq/L (ref 96–112)
Creatinine, Ser: 1.09 mg/dL (ref 0.40–1.20)
GFR: 50.35 mL/min — ABNORMAL LOW (ref >60.00)
Glucose, Bld: 76 mg/dL (ref 70–99)
Potassium: 4.8 meq/L (ref 3.5–5.1)
Sodium: 137 meq/L (ref 135–145)

## 2024-08-03 MED ORDER — SHINGRIX 50 MCG/0.5ML IM SUSR
0.5000 mL | Freq: Once | INTRAMUSCULAR | 0 refills | Status: AC
  Filled 2024-08-03: qty 0.5, 1d supply, fill #0

## 2024-08-03 MED ORDER — ATORVASTATIN CALCIUM 40 MG PO TABS: 40.0000 mg | ORAL_TABLET | Freq: Every day | ORAL | 1 refills | Status: AC

## 2024-08-03 MED ORDER — METFORMIN HCL 500 MG PO TABS: 1000.0000 mg | ORAL_TABLET | Freq: Two times a day (BID) | ORAL | 1 refills | Status: AC

## 2024-08-03 MED ORDER — HYDROCHLOROTHIAZIDE 25 MG PO TABS: 25.0000 mg | ORAL_TABLET | Freq: Every day | ORAL | 1 refills | Status: DC

## 2024-08-03 MED ORDER — LOSARTAN POTASSIUM 100 MG PO TABS: 100.0000 mg | ORAL_TABLET | Freq: Every day | ORAL | 1 refills | Status: AC

## 2024-08-03 NOTE — Assessment & Plan Note (Signed)
 Lab Results  Component Value Date   CHOL 141 04/30/2024   HDL 36.10 (L) 04/30/2024   LDLCALC 88 04/30/2024   TRIG 87.0 04/30/2024   CHOLHDL 4 04/30/2024   Not fasting. Wishes to recheck next visit. Continues lipitor.

## 2024-08-03 NOTE — Assessment & Plan Note (Signed)
 Lab Results  Component Value Date   HGBA1C 6.9 (H) 04/30/2024   HGBA1C 6.6 (H) 10/24/2023   HGBA1C 6.8 (H) 06/23/2023   Lab Results  Component Value Date   MICROALBUR <0.7 10/24/2023   LDLCALC 88 04/30/2024   CREATININE 1.02 04/30/2024   Continues metformin , walking.  Update A1C.

## 2024-08-03 NOTE — Patient Instructions (Signed)
  VISIT SUMMARY: Today, we reviewed your current medications and overall health status. Your diabetes management, blood pressure, cholesterol levels, and other conditions were discussed. We also talked about your recent stress and its impact on your health.  YOUR PLAN: -TYPE 2 DIABETES MELLITUS: Type 2 diabetes is a condition where your body does not use insulin properly, leading to high blood sugar levels. Your A1c has increased slightly to 6.9, but it is still below the target of 7. We checked your A1c today and you should continue taking metformin  500 mg twice daily.  -PRIMARY HYPERTENSION: Primary hypertension is high blood pressure without a known cause. Your blood pressure is well-controlled with losartan , which you take variably based on your readings. Continue taking losartan  100 mg as needed and monitor your blood pressure daily.  -HYPERLIPIDEMIA: Hyperlipidemia is having high levels of fats (lipids) in your blood, which can increase your risk of heart disease. Your LDL cholesterol is 88 mg/dL, which is above the target for diabetes. We will check your cholesterol at your next appointment with a fasting test. Continue taking atorvastatin  40 mg daily.  -GASTROESOPHAGEAL REFLUX DISEASE: Gastroesophageal reflux disease (GERD) is a condition where stomach acid frequently flows back into the tube connecting your mouth and stomach, causing discomfort. Your symptoms are well-controlled with omeprazole . Continue taking omeprazole  40 mg daily.  -ALLERGIC RHINITIS DUE TO POLLEN: Allergic rhinitis is an allergic reaction that causes sneezing, congestion, and a runny nose. Your symptoms have decreased and are managed with loratadine. Continue taking loratadine 10 mg daily.  -GENERAL HEALTH MAINTENANCE: For your general health, you are due for tetanus and shingles vaccinations. Please check with your pharmacy for the availability and cost of these vaccinations.  INSTRUCTIONS: Please monitor your blood  pressure daily and continue with your current medications as discussed. We will check your cholesterol at your next appointment with a fasting test. Also, check with your pharmacy for tetanus and shingles vaccination availability and cost.                      Contains text generated by [employer].

## 2024-08-03 NOTE — Assessment & Plan Note (Addendum)
 At goal on losartan  100mg  and hydrochlorothiazide .  Some days she will take a 1/2 tab of losartan  as long as it is 120 sbp or lower.  BP Readings from Last 3 Encounters:  08/03/24 134/83  04/30/24 137/77  01/21/24 126/81

## 2024-08-03 NOTE — Assessment & Plan Note (Signed)
 Stable on omprazole. Continue same.

## 2024-08-03 NOTE — Progress Notes (Signed)
 Subjective:     Patient ID: Elizabeth Vang, female    DOB: Oct 06, 1950, 73 y.o.   MRN: [id number]  Chief Complaint  Patient presents with   Hypertension    Here for follow up   Diabetes    Here for follow up    Hypertension  Diabetes    Discussed the use of AI scribe software for clinical note transcription with the patient, who gave verbal consent to proceed.  History of Present Illness Elizabeth Vang is a 73 year old female with type 2 diabetes who presents for a follow-up on her medications.  Her diabetes management is under review. Her last hemoglobin A1c in September was 6.9, an increase from 6.6, but still below the target of 7. She is currently on metformin  500 mg twice daily. She has been unable to meet her walking goals due to stress related to her husband's hospitalization following another stroke.  Her blood pressure is generally well-controlled with losartan  100 mg, which she takes about 25% of the time. On other days, she takes half the dose, adjusting based on her morning readings. She also takes hydrochlorothiazide .  She is on Lipitor 40 mg for cholesterol management. Her last cholesterol check in September showed an LDL of 88.  Her reflux symptoms are well-controlled with omeprazole . She experiences allergies, which were particularly severe in November, requiring her to take a double dose of Claritin on several days. She takes Claritin year-round.  She mentions arthritis but states it is manageable and she tries to stay active. She has no new concerns regarding this condition.      Health Maintenance Due  Topic Date Due   Medicare Annual Wellness (AWV)  Never done   Zoster Vaccines- Shingrix  (1 of 2) Never done   DTaP/Tdap/Td (2 - Td or Tdap) 09/18/2023   COVID-19 Vaccine (7 - 2025-26 season) 04/26/2024    Past Medical History:  Diagnosis Date   Arthritis    Breast cancer (HCC)    Cancer (HCC) 12/12/2000   breast   Diabetes (HCC)     Environmental allergies    GERD (gastroesophageal reflux disease)    Hyperlipidemia    Hypertension    Neuropathy    fingers and toes   Osteoporosis     Past Surgical History:  Procedure Laterality Date   APPENDECTOMY  1982   BREAST LUMPECTOMY Right 12/12/00   lumpectomy / axillary node dissection, sentinel node bx (cancer)   GANGLION CYST EXCISION Right 1970   wrist   LAPAROSCOPIC SALPINGO OOPHERECTOMY  1984   benign tumor    Family History  Problem Relation Age of Onset   Hypertension Mother    Stroke Mother    Cancer Mother 20       uterine   Heart disease Mother        CHF   Lung disease Mother        interstital lung fibrosis   Diabetes Father    Lymphoma Father    Colon cancer Paternal Grandfather 8   Hypertension Brother    Diabetes type II Brother    Hypertension Sister        2 sisters   Diabetes Mellitus II Sister        2 sisters   Breast cancer Sister     Social History   Socioeconomic History   Marital status: Married    Spouse name: Not on file   Number of children: Not on file   Years of  education: Not on file   Highest education level: Not on file  Occupational History   Not on file  Tobacco Use   Smoking status: Never   Smokeless tobacco: Never  Substance and Sexual Activity   Alcohol use: Yes    Alcohol/week: 0.0 standard drinks of alcohol    Comment: rarely drinks wine   Drug use: Not on file   Sexual activity: Not on file  Other Topics Concern   Not on file  Social History Narrative   Lives with her husband and elderly mother   Has 2 sons- live locally, no grandchildren   Enjoys reading, travelling   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     Cholecalciferol (VITAMIN D3) 5000 UNIT/ML LIQD Take 5,000 Units by mouth 3 (three) times a week.     docusate sodium (COLACE) 100 MG capsule Take 100 mg by mouth 2 (two) times daily as needed.      Glucosamine HCl 1500 MG TABS Take 1 tablet by mouth daily.     glucose blood (ONETOUCH VERIO) test strip Use as instructed to check blood sugar once a day. DX E11.40 100 each 1   loratadine (CLARITIN) 10 MG tablet Take 10 mg by mouth daily.     Magnesium 250 MG TABS Take by mouth.     Omega-3 Fatty Acids (OMEGA 3 PO) Take 1 capsule by mouth daily.     omeprazole  (PRILOSEC) 40 MG capsule Take 1 capsule (40 mg total) by mouth daily. 90 capsule 1   OVER THE COUNTER MEDICATION Take 500 mg by mouth daily. VITAMIN C /  ROSEHIPS     vitamin E 400 UNIT capsule Take 400 Units by mouth daily as needed.      atorvastatin  (LIPITOR) 40 MG tablet Take 1 tablet (40 mg total) by mouth daily. 90 tablet 1   hydrochlorothiazide  (HYDRODIURIL ) 25 MG tablet TAKE 1 TABLET DAILY 90 tablet 0   losartan  (COZAAR ) 100 MG tablet Take 1 tablet (100 mg total) by mouth daily. 90 tablet 1   metFORMIN  (GLUCOPHAGE ) 500 MG tablet Take 2 tablets (1,000 mg total) by mouth 2 (two) times daily with a meal. 360 tablet 1   Zoster Vaccine Adjuvanted (SHINGRIX ) injection Inject 0.5 ml now and repeat in 2-6 months 0.5 mL 1   No facility-administered medications prior to visit.    Allergies  Allergen Reactions   Oxycodone-Acetaminophen     hallucinations    ROS See HPI    Objective:    Physical Exam Constitutional:      General: She is not in acute distress.    Appearance: Normal appearance. She is well-developed.  HENT:     Head: Normocephalic and atraumatic.     Right Ear: External ear normal.     Left Ear: External ear normal.  Eyes:     General: No scleral icterus. Neck:     Thyroid : No thyromegaly.   Cardiovascular:     Rate and Rhythm: Normal rate and regular rhythm.  Heart sounds: Normal heart sounds. No murmur heard. Pulmonary:     Effort: Pulmonary effort is normal. No respiratory distress.     Breath sounds: Normal breath sounds. No wheezing.  Musculoskeletal:     Cervical back: Neck supple.  Skin:    General: Skin is warm and dry.  Neurological:     Mental Status: She is alert and oriented to person, place, and time.  Psychiatric:        Mood and Affect: Mood normal.        Behavior: Behavior normal.        Thought Content: Thought content normal.        Judgment: Judgment normal.      BP 134/83 (BP Location: Left Arm, Patient Position: Sitting, Cuff Size: Normal)   Pulse 89   Temp 98.1 F (36.7 C) (Oral)   Resp 16   Ht 5' 5 (1.651 m)   Wt 201 lb (91.2 kg)   SpO2 98%   BMI 33.45 kg/m  Wt Readings from Last 3 Encounters:  08/03/24 201 lb (91.2 kg)  04/30/24 198 lb 4 oz (89.9 kg)  01/21/24 201 lb (91.2 kg)       Assessment & Plan:   Problem List Items Addressed This Visit       Unprioritized   Hyperlipidemia   Lab Results  Component Value Date   CHOL 141 04/30/2024   HDL 36.10 (L) 04/30/2024   LDLCALC 88 04/30/2024   TRIG 87.0 04/30/2024   CHOLHDL 4 04/30/2024   Not fasting. Wishes to recheck next visit. Continues lipitor.        Relevant Medications   atorvastatin  (LIPITOR) 40 MG tablet   hydrochlorothiazide  (HYDRODIURIL ) 25 MG tablet   losartan  (COZAAR ) 100 MG tablet   HTN (hypertension)   At goal on losartan  100mg  and hydrochlorothiazide .  Some days she will take a 1/2 tab of losartan  as long as it is 120 sbp or lower.  BP Readings from Last 3 Encounters:  08/03/24 134/83  04/30/24 137/77  01/21/24 126/81         Relevant Medications   atorvastatin  (LIPITOR) 40 MG tablet   hydrochlorothiazide  (HYDRODIURIL ) 25 MG tablet   losartan  (COZAAR ) 100 MG tablet   Other Relevant Orders   Basic Metabolic Panel (BMET)   GERD  (gastroesophageal reflux disease)   Stable on omprazole. Continue same.       Controlled type 2 diabetes mellitus without complication, without long-term current use of insulin (HCC) - Primary   Lab Results  Component Value Date   HGBA1C 6.9 (H) 04/30/2024   HGBA1C 6.6 (H) 10/24/2023   HGBA1C 6.8 (H) 06/23/2023   Lab Results  Component Value Date   MICROALBUR <0.7 10/24/2023   LDLCALC 88 04/30/2024   CREATININE 1.02 04/30/2024   Continues metformin , walking.  Update A1C.       Relevant Medications   metFORMIN  (GLUCOPHAGE ) 500 MG tablet   atorvastatin  (LIPITOR) 40 MG tablet   losartan  (COZAAR ) 100 MG tablet   Allergic rhinitis   Currently stable- continues claritin all year round.        I have discontinued Elizabeth RAMAN. Mcnicholas's Shingrix . I have also changed her hydrochlorothiazide . Additionally, I am having her maintain her docusate sodium, Glucosamine HCl, vitamin E, Omega-3 Fatty Acids (OMEGA 3 PO), loratadine, OVER THE COUNTER MEDICATION, aspirin  EC, glucose blood, Vitamin D3, Magnesium, omeprazole , metFORMIN , atorvastatin , and losartan .  Meds ordered this encounter  Medications   metFORMIN  (GLUCOPHAGE ) 500 MG tablet  Sig: Take 2 tablets (1,000 mg total) by mouth 2 (two) times daily with a meal.    Dispense:  360 tablet    Refill:  1    Supervising Provider:   DOMENICA BLACKBIRD A [[id number]]   atorvastatin  (LIPITOR) 40 MG tablet    Sig: Take 1 tablet (40 mg total) by mouth daily.    Dispense:  90 tablet    Refill:  1    Supervising Provider:   DOMENICA BLACKBIRD A [[id number]]   hydrochlorothiazide  (HYDRODIURIL ) 25 MG tablet    Sig: Take 1 tablet (25 mg total) by mouth daily.    Dispense:  90 tablet    Refill:  1    Supervising Provider:   DOMENICA BLACKBIRD A [[id number]]   losartan  (COZAAR ) 100 MG tablet    Sig: Take 1 tablet (100 mg total) by mouth daily.    Dispense:  90 tablet    Refill:  1    Supervising Provider:   DOMENICA BLACKBIRD A [[id number]]

## 2024-08-03 NOTE — Assessment & Plan Note (Signed)
 Currently stable- continues claritin all year round.

## 2024-08-09 ENCOUNTER — Ambulatory Visit: Payer: Self-pay | Admitting: Family

## 2024-08-16 ENCOUNTER — Other Ambulatory Visit: Payer: Self-pay | Admitting: Family

## 2024-08-16 DIAGNOSIS — I10 Essential (primary) hypertension: Secondary | ICD-10-CM

## 2024-08-31 ENCOUNTER — Other Ambulatory Visit: Payer: Self-pay | Admitting: Family

## 2024-08-31 DIAGNOSIS — I10 Essential (primary) hypertension: Secondary | ICD-10-CM

## 2024-08-31 MED ORDER — HYDROCHLOROTHIAZIDE 25 MG PO TABS: 25.0000 mg | ORAL_TABLET | Freq: Every day | ORAL | 1 refills | Status: AC

## 2024-08-31 NOTE — Telephone Encounter (Signed)
 Copied from CRM #[id number]. Topic: Clinical - Medication Refill >> Aug 31, 2024  4:00 PM Paige D wrote: Medication: hydrochlorothiazide  (HYDRODIURIL ) 25 MG tablet  Has the patient contacted their pharmacy? Yes (Agent: If no, request that the patient contact the pharmacy for the refill. If patient does not wish to contact the pharmacy document the reason why and proceed with request.) (Agent: If yes, when and what did the pharmacy advise?)  This is the patient's preferred pharmacy:    [facility] - [place], [place] - [address] [facility] [address] [place] [address] Phone: [phone] Fax: [phone]  Is this the correct pharmacy for this prescription? Yes If no, delete pharmacy and type the correct one.   Has the prescription been filled recently? no  Is the patient out of the medication? Yes  Has the patient been seen for an appointment in the last year OR does the patient have an upcoming appointment? Yes  Can we respond through MyChart? No, call   Agent: Please be advised that Rx refills may take up to 3 business days. We ask that you follow-up with your pharmacy.

## 2024-09-09 ENCOUNTER — Other Ambulatory Visit: Payer: Self-pay | Admitting: Emergency Medicine

## 2024-09-09 ENCOUNTER — Other Ambulatory Visit: Payer: Self-pay | Admitting: Family

## 2024-09-09 DIAGNOSIS — Z1231 Encounter for screening mammogram for malignant neoplasm of breast: Secondary | ICD-10-CM

## 2024-10-27 ENCOUNTER — Ambulatory Visit

## 2024-11-09 ENCOUNTER — Ambulatory Visit: Admitting: Family
